# Patient Record
Sex: Male | Born: 1935 | Marital: Married | Smoking: Former smoker
Health system: Southern US, Community
[De-identification: ages and names within clinical notes are randomized; demographics above are authoritative.]

## PROBLEM LIST (undated history)

## (undated) DIAGNOSIS — R351 Nocturia: Secondary | ICD-10-CM

## (undated) DIAGNOSIS — I10 Essential (primary) hypertension: Secondary | ICD-10-CM

## (undated) DIAGNOSIS — R972 Elevated prostate specific antigen [PSA]: Secondary | ICD-10-CM

## (undated) DIAGNOSIS — K4090 Unilateral inguinal hernia, without obstruction or gangrene, not specified as recurrent: Secondary | ICD-10-CM

## (undated) DIAGNOSIS — N400 Enlarged prostate without lower urinary tract symptoms: Secondary | ICD-10-CM

## (undated) DIAGNOSIS — IMO0001 Reserved for inherently not codable concepts without codable children: Secondary | ICD-10-CM

## (undated) DIAGNOSIS — R3915 Urgency of urination: Secondary | ICD-10-CM

## (undated) DIAGNOSIS — K219 Gastro-esophageal reflux disease without esophagitis: Secondary | ICD-10-CM

## (undated) DIAGNOSIS — R1310 Dysphagia, unspecified: Secondary | ICD-10-CM

## (undated) DIAGNOSIS — M1990 Unspecified osteoarthritis, unspecified site: Secondary | ICD-10-CM

## (undated) DIAGNOSIS — N410 Acute prostatitis: Secondary | ICD-10-CM

## (undated) HISTORY — DX: Elevated prostate specific antigen (PSA): R97.20

## (undated) HISTORY — DX: Gastro-esophageal reflux disease without esophagitis: K21.9

## (undated) HISTORY — DX: Reserved for inherently not codable concepts without codable children: IMO0001

## (undated) HISTORY — DX: Dysphagia, unspecified: R13.10

## (undated) HISTORY — PX: HERNIA REPAIR: SHX51

## (undated) HISTORY — PX: LEG SURGERY: SHX1003

## (undated) HISTORY — PX: CATARACT EXTRACTION: SUR2

## (undated) HISTORY — DX: Urgency of urination: R39.15

## (undated) HISTORY — DX: Unilateral inguinal hernia, without obstruction or gangrene, not specified as recurrent: K40.90

## (undated) HISTORY — DX: Benign prostatic hyperplasia without lower urinary tract symptoms: N40.0

## (undated) HISTORY — DX: Unspecified osteoarthritis, unspecified site: M19.90

## (undated) HISTORY — DX: Essential (primary) hypertension: I10

## (undated) HISTORY — DX: Nocturia: R35.1

## (undated) HISTORY — DX: Acute prostatitis: N41.0

---

## 2008-07-31 ENCOUNTER — Ambulatory Visit: Payer: Self-pay | Admitting: Unknown Physician Specialty

## 2008-08-31 ENCOUNTER — Ambulatory Visit: Payer: Self-pay | Admitting: Unknown Physician Specialty

## 2010-07-09 ENCOUNTER — Ambulatory Visit: Payer: Self-pay | Admitting: Unknown Physician Specialty

## 2010-07-30 ENCOUNTER — Ambulatory Visit: Payer: Self-pay | Admitting: Surgery

## 2010-07-30 DIAGNOSIS — I10 Essential (primary) hypertension: Secondary | ICD-10-CM

## 2010-08-08 ENCOUNTER — Ambulatory Visit: Payer: Self-pay | Admitting: Surgery

## 2010-08-09 LAB — PATHOLOGY REPORT

## 2011-10-21 ENCOUNTER — Ambulatory Visit: Payer: Self-pay | Admitting: Unknown Physician Specialty

## 2011-10-22 LAB — PATHOLOGY REPORT

## 2014-12-04 ENCOUNTER — Other Ambulatory Visit: Payer: Self-pay | Admitting: Family Medicine

## 2014-12-04 DIAGNOSIS — M79604 Pain in right leg: Secondary | ICD-10-CM

## 2014-12-05 ENCOUNTER — Ambulatory Visit
Admission: RE | Admit: 2014-12-05 | Discharge: 2014-12-05 | Disposition: A | Discharge status: Home / Self Care | Payer: Commercial Managed Care - HMO | Source: Ambulatory Visit | Attending: Family Medicine | Admitting: Family Medicine

## 2014-12-05 DIAGNOSIS — M79604 Pain in right leg: Secondary | ICD-10-CM | POA: Diagnosis not present

## 2015-02-27 ENCOUNTER — Encounter: Payer: Self-pay | Admitting: *Deleted

## 2015-03-02 ENCOUNTER — Ambulatory Visit (INDEPENDENT_AMBULATORY_CARE_PROVIDER_SITE_OTHER): Payer: Commercial Managed Care - HMO | Admitting: Urology

## 2015-03-02 ENCOUNTER — Encounter: Payer: Self-pay | Admitting: Urology

## 2015-03-02 VITALS — BP 160/79 | HR 97 | Ht 67.0 in | Wt 186.9 lb

## 2015-03-02 DIAGNOSIS — N401 Enlarged prostate with lower urinary tract symptoms: Secondary | ICD-10-CM

## 2015-03-02 DIAGNOSIS — N138 Other obstructive and reflux uropathy: Secondary | ICD-10-CM

## 2015-03-02 NOTE — Progress Notes (Signed)
03/02/2015 8:00 PM   Adam [name] 08/06/1929 [id number]  Referring provider: Marina Goodellale E Feldpausch, MD [address] [facility] [place], [place] [address]  Chief Complaint  Patient presents with  . Benign Prostatic Hypertrophy    1 year recheck  . Elevated PSA    HPI: Patient is an 81 year old white male with a history of BPH with LUTS who presents today for his yearly follow-up.  BPH WITH LUTS His IPSS score today is 12, which is moderate lower urinary tract symptomatology. He is pleased with his quality life due to his urinary symptoms.  His major complaint today post void dribbling and nocturia x 1.  He has had these symptoms for several years.  He denies any dysuria, hematuria or suprapubic pain.  He also denies any recent fevers, chills, nausea or vomiting.  He has a family history of PCa, with his brother having prostate cancer.         IPSS      03/02/15 0900       International Prostate Symptom Score   How often have you had the sensation of not emptying your bladder? Less than 1 in 5     How often have you had to urinate less than every two hours? Less than half the time     How often have you found you stopped and started again several times when you urinated? More than half the time     How often have you found it difficult to postpone urination? Less than 1 in 5 times     How often have you had a weak urinary stream? About half the time     How often have you had to strain to start urination? Not at All     How many times did you typically get up at night to urinate? 1 Time     Total IPSS Score 12     Quality of Life due to urinary symptoms   If you were to spend the rest of your life with your urinary condition just the way it is now how would you feel about that? Pleased        Score:  1-7 Mild 8-19 Moderate 20-35 Severe     PMH: Past Medical History  Diagnosis Date  . HTN (hypertension)   . Osteoarthritis   .  Unilateral inguinal hernia   . Esophageal reflux   . Dysphagia   . Elevated PSA   . Frequency   . Urgency of micturation   . Nocturia   . BPH (benign prostatic hypertrophy)   . Acute prostatitis     Surgical History: Past Surgical History  Procedure Laterality Date  . Hernia repair      bilateral inguinal  . Leg surgery      leg trauma as a child  . Cataract extraction Bilateral     Home Medications:    Medication List       This list is accurate as of: 03/02/15 11:59 PM.  Always use your most recent med list.               aspirin EC 81 MG tablet  Take by mouth.     cetirizine 10 MG tablet  Commonly known as:  ZYRTEC  Take by mouth.     enalapril 20 MG tablet  Commonly known as:  VASOTEC  Take by mouth.     Fish Oil 1000 MG Caps  Take by  mouth.     hydrochlorothiazide 25 MG tablet  Commonly known as:  HYDRODIURIL  Take by mouth.     MULTI-VITAMINS Tabs  Take by mouth.     omeprazole 20 MG capsule  Commonly known as:  PRILOSEC  Take by mouth.     traMADol 50 MG tablet  Commonly known as:  ULTRAM  Take by mouth.     triamcinolone lotion 0.1 %  Commonly known as:  KENALOG  Apply topically.     vitamin C 500 MG tablet  Commonly known as:  ASCORBIC ACID  Take by mouth.        Allergies:  Allergies  Allergen Reactions  . Penicillin V Potassium Rash    Family History: Family History  Problem Relation Age of Onset  . Colon cancer    . Diabetes    . Heart disease    . Skin cancer    . Kidney disease Neg Hx   . Prostate cancer Brother     Social History:  reports that he has quit smoking. He does not have any smokeless tobacco history on file. He reports that he does not drink alcohol or use illicit drugs.  ROS: UROLOGY Frequent Urination?: No Hard to postpone urination?: No Burning/pain with urination?: No Get up at night to urinate?: Yes Leakage of urine?: Yes Urine stream starts and stops?: No Trouble starting stream?:  No Do you have to strain to urinate?: No Blood in urine?: No Urinary tract infection?: No Sexually transmitted disease?: No Injury to kidneys or bladder?: No Painful intercourse?: No Weak stream?: No Erection problems?: No Penile pain?: No  Gastrointestinal Nausea?: No Vomiting?: No Indigestion/heartburn?: No Diarrhea?: No Constipation?: No  Constitutional Fever: No Night sweats?: No Weight loss?: No Fatigue?: No  Skin Skin rash/lesions?: No Itching?: No  Eyes Blurred vision?: No Double vision?: No  Ears/Nose/Throat Sore throat?: No Sinus problems?: No  Hematologic/Lymphatic Swollen glands?: No Easy bruising?: No  Cardiovascular Leg swelling?: No Chest pain?: No  Respiratory Cough?: No Shortness of breath?: No  Endocrine Excessive thirst?: No  Musculoskeletal Back pain?: No Joint pain?: No  Neurological Headaches?: No Dizziness?: No  Psychologic Depression?: No Anxiety?: No  Physical Exam: BP 160/79 mmHg  Pulse 97  Ht  (1.702 m)  Wt 186 lb 14.4 oz (84.777 kg)  BMI 29.27 kg/m2  GU: No CVA tenderness.  No bladder fullness or masses.  Patient with circumcised phallus.  Urethral meatus is patent.  No penile discharge. No penile lesions or rashes. Scrotum without lesions, cysts, rashes and/or edema.  Testicles are located scrotally bilaterally. No masses are appreciated in the testicles. Left and right epididymis are normal. Rectal: Patient with  normal sphincter tone. Anus and perineum without scarring or rashes. No rectal masses are appreciated. Prostate is approximately 70 (could not palpated entire gland due to buttocks tissue) grams.      Laboratory Data: PSA history  3.4 ng/mL on 08/08/2012  4.3 ng/mL on 03/01/2013  5.0 ng/mL on 04/01/2013  2.9 ng/mL on 08/29/2013  3.0 ng/mL on 03/01/2014     Assessment & Plan:    1. BPH with LUTS:   Patient's I PSS score is 12/1.  He does not find his postvoid dribbling or nocturia especially  bothersome to him at this time. We will continue to monitor. He will return in 1 year for IPSS score and exam.  - PSA   Return in about 1 year (around 03/01/2016) for IPSS and exam.  Danaysia Rader,  PA-C  [facility] [address] [place], [place] [address] [phone]

## 2015-03-03 LAB — PSA: PROSTATE SPECIFIC AG, SERUM: 3.7 ng/mL (ref 0.0–4.0)

## 2015-03-04 DIAGNOSIS — N401 Enlarged prostate with lower urinary tract symptoms: Principal | ICD-10-CM

## 2015-03-04 DIAGNOSIS — N138 Other obstructive and reflux uropathy: Secondary | ICD-10-CM | POA: Insufficient documentation

## 2015-03-07 ENCOUNTER — Telehealth: Payer: Self-pay

## 2015-03-07 NOTE — Telephone Encounter (Signed)
Spoke with pt in reference to PSA. Pt voiced understanding.  

## 2015-03-07 NOTE — Telephone Encounter (Signed)
-----   Message from Harle BattiestShannon A McGowan, PA-C sent at 03/04/2015  8:01 PM EST ----- PSA is stable.  We will see him next year.

## 2015-07-10 DIAGNOSIS — M1712 Unilateral primary osteoarthritis, left knee: Secondary | ICD-10-CM | POA: Insufficient documentation

## 2015-07-10 DIAGNOSIS — M1711 Unilateral primary osteoarthritis, right knee: Secondary | ICD-10-CM | POA: Insufficient documentation

## 2016-01-04 DIAGNOSIS — E7800 Pure hypercholesterolemia, unspecified: Secondary | ICD-10-CM | POA: Insufficient documentation

## 2016-01-04 DIAGNOSIS — I10 Essential (primary) hypertension: Secondary | ICD-10-CM | POA: Insufficient documentation

## 2016-01-04 DIAGNOSIS — E1122 Type 2 diabetes mellitus with diabetic chronic kidney disease: Secondary | ICD-10-CM | POA: Insufficient documentation

## 2016-01-04 DIAGNOSIS — N183 Chronic kidney disease, stage 3 (moderate): Secondary | ICD-10-CM

## 2016-01-04 DIAGNOSIS — K219 Gastro-esophageal reflux disease without esophagitis: Secondary | ICD-10-CM | POA: Insufficient documentation

## 2016-03-03 ENCOUNTER — Encounter: Payer: Self-pay | Admitting: Urology

## 2016-03-03 ENCOUNTER — Ambulatory Visit (INDEPENDENT_AMBULATORY_CARE_PROVIDER_SITE_OTHER): Payer: Commercial Managed Care - HMO | Admitting: Urology

## 2016-03-03 VITALS — BP 148/70 | HR 96 | Ht 67.0 in | Wt 176.1 lb

## 2016-03-03 DIAGNOSIS — N401 Enlarged prostate with lower urinary tract symptoms: Secondary | ICD-10-CM

## 2016-03-03 DIAGNOSIS — N138 Other obstructive and reflux uropathy: Secondary | ICD-10-CM | POA: Diagnosis not present

## 2016-03-03 NOTE — Progress Notes (Signed)
03/03/2016 2:38 PM   Adam Russellobert E Propst 01/10/1930 [id number]  Referring provider: Marina Goodellale E Feldpausch, MD [address] [facility] [place] [place], [place] [id number]  Chief Complaint  Patient presents with  . Benign Prostatic Hypertrophy    HPI: Patient is an 82 year old Caucasian male with a history of BPH with LUTS who presents today for his yearly follow-up.  BPH WITH LUTS His IPSS score today is 12, which is moderate lower urinary tract symptomatology. He is pleased with his quality life due to his urinary symptoms.  His previous IPSS score was 12/1.  His major complaint today post void dribbling and nocturia x 1.  He has had these symptoms for several years.  He denies any dysuria, hematuria or suprapubic pain.  He also denies any recent fevers, chills, nausea or vomiting.  He has a family history of PCa, with his brother having prostate cancer.         IPSS    Row Name 03/03/16 1400         International Prostate Symptom Score   How often have you had the sensation of not emptying your bladder? Less than 1 in 5     How often have you had to urinate less than every two hours? Less than half the time     How often have you found you stopped and started again several times when you urinated? Not at All     How often have you found it difficult to postpone urination? Not at All     How often have you had a weak urinary stream? About half the time     How often have you had to strain to start urination? About half the time     How many times did you typically get up at night to urinate? 3 Times     Total IPSS Score 12       Quality of Life due to urinary symptoms   If you were to spend the rest of your life with your urinary condition just the way it is now how would you feel about that? Pleased        Score:  1-7 Mild 8-19 Moderate 20-35 Severe   PMH: Past Medical History:  Diagnosis Date  . Acute prostatitis   . BPH (benign prostatic  hypertrophy)   . Dysphagia   . Elevated PSA   . Esophageal reflux   . Frequency   . HTN (hypertension)   . Nocturia   . Osteoarthritis   . Unilateral inguinal hernia   . Urgency of micturation     Surgical History: Past Surgical History:  Procedure Laterality Date  . CATARACT EXTRACTION Bilateral   . HERNIA REPAIR     bilateral inguinal  . LEG SURGERY     leg trauma as a child    Home Medications:    Medication List       Accurate as of 03/03/16  2:38 PM. Always use your most recent med list.          aspirin EC 81 MG tablet Take by mouth.   cetirizine 10 MG tablet Commonly known as:  ZYRTEC Take by mouth.   enalapril 20 MG tablet Commonly known as:  VASOTEC Take by mouth.   Fish Oil 1000 MG Caps Take by mouth.   hydrochlorothiazide 25 MG tablet Commonly known as:  HYDRODIURIL Take by mouth.   MULTI-VITAMINS Tabs Take by mouth.   omeprazole 20 MG  capsule Commonly known as:  PRILOSEC Take by mouth.   traMADol 50 MG tablet Commonly known as:  ULTRAM Take by mouth.   triamcinolone lotion 0.1 % Commonly known as:  KENALOG Apply topically.   vitamin C 500 MG tablet Commonly known as:  ASCORBIC ACID Take by mouth.       Allergies:  Allergies  Allergen Reactions  . Penicillin V Potassium Rash    Family History: Family History  Problem Relation Age of Onset  . Colon cancer    . Diabetes    . Heart disease    . Skin cancer    . Kidney disease Neg Hx   . Prostate cancer Brother     Social History:  reports that he has quit smoking. He has never used smokeless tobacco. He reports that he does not drink alcohol or use drugs.  ROS: UROLOGY Frequent Urination?: No Hard to postpone urination?: No Burning/pain with urination?: No Get up at night to urinate?: No Leakage of urine?: No Urine stream starts and stops?: No Trouble starting stream?: No Do you have to strain to urinate?: No Blood in urine?: No Urinary tract infection?:  No Sexually transmitted disease?: No Injury to kidneys or bladder?: No Painful intercourse?: No Weak stream?: Yes Erection problems?: No Penile pain?: No  Gastrointestinal Nausea?: No Vomiting?: No Indigestion/heartburn?: No Diarrhea?: Yes Constipation?: No  Constitutional Fever: No Night sweats?: No Weight loss?: No Fatigue?: No  Skin Skin rash/lesions?: No Itching?: No  Eyes Blurred vision?: No Double vision?: No  Ears/Nose/Throat Sore throat?: No Sinus problems?: No  Hematologic/Lymphatic Swollen glands?: No Easy bruising?: No  Cardiovascular Leg swelling?: No Chest pain?: No  Respiratory Cough?: No Shortness of breath?: No  Endocrine Excessive thirst?: No  Musculoskeletal Back pain?: No Joint pain?: No  Neurological Headaches?: No Dizziness?: No  Psychologic Depression?: No Anxiety?: No  Physical Exam: BP (!) 148/70 (BP Location: Left Arm, Patient Position: Sitting, Cuff Size: Normal)   Pulse 96   Ht 5\' 7"  (1.702 m)   Wt 176 lb 1.6 oz (79.9 kg)   BMI 27.58 kg/m   GU: No CVA tenderness.  No bladder fullness or masses.  Patient with circumcised phallus.  Urethral meatus is patent.  No penile discharge. No penile lesions or rashes. Scrotum without lesions, cysts, rashes and/or edema.  Testicles are located scrotally bilaterally. No masses are appreciated in the testicles. Left and right epididymis are normal. Rectal: Patient with  normal sphincter tone. Anus and perineum without scarring or rashes. No rectal masses are appreciated. Prostate is approximately 70 (could not palpated entire gland due to buttocks tissue) grams.      Laboratory Data: PSA history  3.4 ng/mL on 08/08/2012  4.3 ng/mL on 03/01/2013  5.0 ng/mL on 04/01/2013  2.9 ng/mL on 08/29/2013  3.0 ng/mL on 03/01/2014  3.7 ng/mL on 03/02/2015  Assessment & Plan:    1. BPH with LUTS:   Patient's I PSS score is 12/1.  He does not find his postvoid dribbling or nocturia  especially bothersome to him at this time. We will continue to monitor. He will return in 1 year for IPSS score and exam.    Return in about 1 year (around 03/03/2017) for IPSS  and exam.  Michiel CowboySHANNON Marice Guidone, [facility]-C  [facility] [address], [address] [place], [place] [address] [phone]

## 2016-03-12 ENCOUNTER — Encounter: Payer: Self-pay | Admitting: Urology

## 2017-03-02 NOTE — Progress Notes (Signed)
03/03/2017 10:33 AM   [name] 1929-09-20 [id number]  Referring provider: Marina GoodellFeldpausch, Dale E, MD [address] [place], [place] [address]  Chief Complaint  Patient presents with  . Benign Prostatic Hypertrophy    HPI: Patient is an 83 year old Caucasian male with a history of BPH with LUTS who presents today for his yearly follow-up.  BPH WITH LUTS His IPSS score today is 20, which is severe lower urinary tract symptomatology. He is mixed with his quality life due to his urinary symptoms.  His previous IPSS score was 12/1.  He states he is going great today.  He denies any dysuria, hematuria or suprapubic pain.  He also denies any recent fevers, chills, nausea or vomiting.  He has a family history of PCa, with his brother having prostate cancer.     IPSS    Row Name 03/03/17 1000         International Prostate Symptom Score   How often have you had the sensation of not emptying your bladder?  Almost always     How often have you had to urinate less than every two hours?  Almost always     How often have you found you stopped and started again several times when you urinated?  Not at All     How often have you found it difficult to postpone urination?  Not at All     How often have you had a weak urinary stream?  Almost always     How often have you had to strain to start urination?  Not at All     How many times did you typically get up at night to urinate?  5 Times     Total IPSS Score  20       Quality of Life due to urinary symptoms   If you were to spend the rest of your life with your urinary condition just the way it is now how would you feel about that?  Mixed        Score:  1-7 Mild 8-19 Moderate 20-35 Severe   PMH: Past Medical History:  Diagnosis Date  . Acute prostatitis   . BPH (benign prostatic hypertrophy)   . Dysphagia   . Elevated PSA   . Esophageal reflux   . Frequency   . HTN (hypertension)   . Nocturia   . Osteoarthritis   .  Unilateral inguinal hernia   . Urgency of micturation     Surgical History: Past Surgical History:  Procedure Laterality Date  . CATARACT EXTRACTION Bilateral   . HERNIA REPAIR     bilateral inguinal  . LEG SURGERY     leg trauma as a child    Home Medications:  Allergies as of 03/03/2017      Reactions   Penicillin V Potassium Rash      Medication List        Accurate as of 03/03/17 10:33 AM. Always use your most recent med list.          aspirin EC 81 MG tablet Take 81 mg daily by mouth.   cetirizine 10 MG tablet Commonly known as:  ZYRTEC Take 10 mg as needed by mouth.   enalapril 20 MG tablet Commonly known as:  VASOTEC Take 20 mg daily by mouth.   Fish Oil 1000 MG Caps Take 1 capsule daily by mouth.   hydrochlorothiazide 25 MG tablet Commonly known as:  HYDRODIURIL Take 25 mg daily by mouth.  MULTI-VITAMINS Tabs Take 1 capsule daily by mouth.   omeprazole 20 MG capsule Commonly known as:  PRILOSEC Take 20 mg daily by mouth.   traMADol 50 MG tablet Commonly known as:  ULTRAM Take 50 mg daily as needed by mouth.   triamcinolone lotion 0.1 % Commonly known as:  KENALOG Apply 1 application 2 (two) times daily as needed topically.   vitamin C 500 MG tablet Commonly known as:  ASCORBIC ACID Take by mouth.       Allergies:  Allergies  Allergen Reactions  . Penicillin V Potassium Rash    Family History: Family History  Problem Relation Age of Onset  . Colon cancer Unknown   . Diabetes Unknown   . Heart disease Unknown   . Skin cancer Unknown   . Prostate cancer Brother   . Kidney disease Neg Hx     Social History:  reports that he quit smoking about 23 years ago. he has never used smokeless tobacco. He reports that he does not drink alcohol or use drugs.  ROS: UROLOGY Frequent Urination?: No Hard to postpone urination?: No Burning/pain with urination?: No Get up at night to urinate?: No Leakage of urine?: No Urine stream  starts and stops?: No Trouble starting stream?: No Do you have to strain to urinate?: No Blood in urine?: No Urinary tract infection?: No Sexually transmitted disease?: No Injury to kidneys or bladder?: No Painful intercourse?: No Weak stream?: No Erection problems?: No Penile pain?: No  Gastrointestinal Nausea?: No Vomiting?: No Indigestion/heartburn?: No Diarrhea?: No Constipation?: No  Constitutional Fever: No Night sweats?: No Weight loss?: Yes Fatigue?: No  Skin Skin rash/lesions?: Yes Itching?: No  Eyes Blurred vision?: No Double vision?: No  Ears/Nose/Throat Sore throat?: No Sinus problems?: No  Hematologic/Lymphatic Swollen glands?: No Easy bruising?: No  Cardiovascular Leg swelling?: No Chest pain?: No  Respiratory Cough?: No Shortness of breath?: No  Endocrine Excessive thirst?: No  Musculoskeletal Back pain?: No Joint pain?: No  Neurological Headaches?: No Dizziness?: No  Psychologic Depression?: No Anxiety?: No  Physical Exam: BP (!) 166/95   Pulse 98   Ht 5\' 7"  (1.702 m)   Wt 171 lb 1.6 oz (77.6 kg)   BMI 26.80 kg/m   GU: No CVA tenderness.  No bladder fullness or masses.  Patient with circumcised phallus.  Urethral meatus is patent.  No penile discharge. No penile lesions or rashes. Scrotum without lesions, cysts, rashes and/or edema.  Testicles are located scrotally bilaterally. No masses are appreciated in the testicles. Left and right epididymis are normal. Rectal: Patient with  normal sphincter tone. Anus and perineum without scarring or rashes. No rectal masses are appreciated. Prostate is approximately 70 (could not palpated entire gland due to buttocks tissue) grams.      Laboratory Data: PSA history  3.4 ng/mL on 08/08/2012  4.3 ng/mL on 03/01/2013  5.0 ng/mL on 04/01/2013  2.9 ng/mL on 08/29/2013  3.0 ng/mL on 03/01/2014  3.7 ng/mL on 03/02/2015  Assessment & Plan:    1. BPH with LUTS:   Patient's I PSS score  is 20/3, it is worsening.  He does not find his postvoid dribbling or nocturia especially bothersome to him at this time. We will continue to monitor. He will return in 1 year for IPSS score and exam.  Return in about 1 year (around 03/03/2018) for I PSS and exam.  Adam [name], [facility] HospitalA-C  [facility] [address], [address] [place], [place] [address] [phone]

## 2017-03-03 ENCOUNTER — Encounter: Payer: Self-pay | Admitting: Urology

## 2017-03-03 ENCOUNTER — Ambulatory Visit: Payer: Medicare HMO | Admitting: Urology

## 2017-03-03 VITALS — BP 166/95 | HR 98 | Ht 67.0 in | Wt 171.1 lb

## 2017-03-03 DIAGNOSIS — N401 Enlarged prostate with lower urinary tract symptoms: Secondary | ICD-10-CM

## 2017-03-03 DIAGNOSIS — N138 Other obstructive and reflux uropathy: Secondary | ICD-10-CM

## 2017-10-16 ENCOUNTER — Encounter: Payer: Self-pay | Admitting: Radiology

## 2017-10-16 ENCOUNTER — Emergency Department: Payer: Medicare HMO

## 2017-10-16 ENCOUNTER — Emergency Department
Admission: EM | Admit: 2017-10-16 | Discharge: 2017-10-16 | Disposition: A | Discharge status: Home / Self Care | Payer: Medicare HMO | Attending: Student in an Organized Health Care Education/Training Program | Admitting: Student in an Organized Health Care Education/Training Program

## 2017-10-16 DIAGNOSIS — R000 Tachycardia, unspecified: Secondary | ICD-10-CM | POA: Insufficient documentation

## 2017-10-16 DIAGNOSIS — S51012A Laceration without foreign body of left elbow, initial encounter: Secondary | ICD-10-CM | POA: Diagnosis not present

## 2017-10-16 DIAGNOSIS — M79602 Pain in left arm: Secondary | ICD-10-CM

## 2017-10-16 DIAGNOSIS — Y9389 Activity, other specified: Secondary | ICD-10-CM | POA: Insufficient documentation

## 2017-10-16 DIAGNOSIS — Z7982 Long term (current) use of aspirin: Secondary | ICD-10-CM | POA: Insufficient documentation

## 2017-10-16 DIAGNOSIS — Y999 Unspecified external cause status: Secondary | ICD-10-CM | POA: Insufficient documentation

## 2017-10-16 DIAGNOSIS — S161XXA Strain of muscle, fascia and tendon at neck level, initial encounter: Secondary | ICD-10-CM | POA: Insufficient documentation

## 2017-10-16 DIAGNOSIS — S199XXA Unspecified injury of neck, initial encounter: Secondary | ICD-10-CM | POA: Diagnosis present

## 2017-10-16 DIAGNOSIS — Z87891 Personal history of nicotine dependence: Secondary | ICD-10-CM | POA: Insufficient documentation

## 2017-10-16 DIAGNOSIS — I129 Hypertensive chronic kidney disease with stage 1 through stage 4 chronic kidney disease, or unspecified chronic kidney disease: Secondary | ICD-10-CM | POA: Insufficient documentation

## 2017-10-16 DIAGNOSIS — N183 Chronic kidney disease, stage 3 (moderate): Secondary | ICD-10-CM | POA: Diagnosis not present

## 2017-10-16 DIAGNOSIS — E1122 Type 2 diabetes mellitus with diabetic chronic kidney disease: Secondary | ICD-10-CM | POA: Insufficient documentation

## 2017-10-16 DIAGNOSIS — Z79899 Other long term (current) drug therapy: Secondary | ICD-10-CM | POA: Insufficient documentation

## 2017-10-16 DIAGNOSIS — Y92410 Unspecified street and highway as the place of occurrence of the external cause: Secondary | ICD-10-CM | POA: Diagnosis not present

## 2017-10-16 LAB — CBC WITH DIFFERENTIAL/PLATELET
BASOS ABS: 0 10*3/uL (ref 0–0.1)
Basophils Relative: 1 %
EOS ABS: 0.1 10*3/uL (ref 0–0.7)
Eosinophils Relative: 2 %
HCT: 44.7 % (ref 40.0–52.0)
HEMOGLOBIN: 15.3 g/dL (ref 13.0–18.0)
LYMPHS ABS: 1.5 10*3/uL (ref 1.0–3.6)
LYMPHS PCT: 21 %
MCH: 32.2 pg (ref 26.0–34.0)
MCHC: 34.2 g/dL (ref 32.0–36.0)
MCV: 94 fL (ref 80.0–100.0)
Monocytes Absolute: 0.8 10*3/uL (ref 0.2–1.0)
Monocytes Relative: 11 %
NEUTROS PCT: 65 %
Neutro Abs: 4.6 10*3/uL (ref 1.4–6.5)
Platelets: 152 10*3/uL (ref 150–440)
RBC: 4.75 MIL/uL (ref 4.40–5.90)
RDW: 12.7 % (ref 11.5–14.5)
WBC: 7 10*3/uL (ref 3.8–10.6)

## 2017-10-16 LAB — COMPREHENSIVE METABOLIC PANEL
ALBUMIN: 3.9 g/dL (ref 3.5–5.0)
ALK PHOS: 76 U/L (ref 38–126)
ALT: 20 U/L (ref 0–44)
AST: 36 U/L (ref 15–41)
Anion gap: 9 (ref 5–15)
BUN: 27 mg/dL — AB (ref 8–23)
CALCIUM: 8.8 mg/dL — AB (ref 8.9–10.3)
CO2: 22 mmol/L (ref 22–32)
CREATININE: 1.41 mg/dL — AB (ref 0.61–1.24)
Chloride: 103 mmol/L (ref 98–111)
GFR calc non Af Amer: 44 mL/min — ABNORMAL LOW (ref >60)
GFR, EST AFRICAN AMERICAN: 50 mL/min — AB (ref >60)
GLUCOSE: 196 mg/dL — AB (ref 70–99)
Potassium: 3.7 mmol/L (ref 3.5–5.1)
SODIUM: 134 mmol/L — AB (ref 135–145)
Total Bilirubin: 0.5 mg/dL (ref 0.3–1.2)
Total Protein: 6.5 g/dL (ref 6.5–8.1)

## 2017-10-16 LAB — LIPASE, BLOOD: Lipase: 28 U/L (ref 11–51)

## 2017-10-16 LAB — TROPONIN I: Troponin I: <0.03 ng/mL (ref <0.03)

## 2017-10-16 MED ORDER — IOPAMIDOL (ISOVUE-370) INJECTION 76%
75.0000 mL | Freq: Once | INTRAVENOUS | Status: AC | PRN
  Administered 2017-10-16: 75 mL via INTRAVENOUS

## 2017-10-16 MED ORDER — LIDOCAINE 5 % EX PTCH: 1.0000 | MEDICATED_PATCH | Freq: Two times a day (BID) | CUTANEOUS | 0 refills | Status: DC

## 2017-10-16 MED ORDER — SODIUM CHLORIDE 0.9 % IV BOLUS
250.0000 mL | Freq: Once | INTRAVENOUS | Status: AC
  Administered 2017-10-16: 250 mL via INTRAVENOUS

## 2017-10-16 MED ORDER — ACETAMINOPHEN 500 MG PO TABS: 1000.0000 mg | ORAL_TABLET | Freq: Once | ORAL | Status: DC
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
## 2017-10-16 NOTE — Discharge Instructions (Addendum)
°  IMPRESSION:  1. No CT evidence for acute traumatic injury within the chest,  abdomen, and pelvis.  2. Evidence for asbestos exposure with scattered pleural thickening  with calcified pleural plaques involving the lungs bilaterally, left  greater than right, with 3.8 x 6.4 cm area of masslike enhancement  along the left hemidiaphragm. Further workup with PET-CT suggested  for further evaluation. Alternatively, a short interval follow-up CT  in 3 months to evaluate stability may also be helpful for further  evaluation.  3. Moderate hiatal hernia.  4. Colonic diverticulosis without evidence for acute diverticulitis.  5. Enlarged prostate.
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
## 2018-03-02 ENCOUNTER — Ambulatory Visit: Payer: Medicare HMO | Admitting: Urology

## 2018-03-05 ENCOUNTER — Ambulatory Visit: Payer: Medicare HMO | Admitting: Urology

## 2018-03-08 ENCOUNTER — Ambulatory Visit (INDEPENDENT_AMBULATORY_CARE_PROVIDER_SITE_OTHER): Payer: Medicare HMO | Admitting: Urology

## 2018-03-08 ENCOUNTER — Encounter: Payer: Self-pay | Admitting: Urology

## 2018-03-08 VITALS — BP 156/79 | HR 121 | Ht 65.0 in | Wt 160.0 lb

## 2018-03-08 DIAGNOSIS — R351 Nocturia: Secondary | ICD-10-CM

## 2018-03-08 DIAGNOSIS — N401 Enlarged prostate with lower urinary tract symptoms: Secondary | ICD-10-CM

## 2018-03-08 DIAGNOSIS — N138 Other obstructive and reflux uropathy: Secondary | ICD-10-CM | POA: Diagnosis not present

## 2018-03-08 LAB — BLADDER SCAN AMB NON-IMAGING

## 2018-03-08 MED ORDER — TAMSULOSIN HCL 0.4 MG PO CAPS: 0.4000 mg | ORAL_CAPSULE | Freq: Every day | ORAL | 1 refills | Status: DC

## 2018-03-08 NOTE — Progress Notes (Signed)
03/08/2018 10:13 AM   Adam [name] [birth year] [id number]  Referring provider: Marina Goodell, MD [address] [place], [place] [address]  Chief Complaint  Patient presents with  . Benign Prostatic Hypertrophy    1year    HPI: Patient is an 84 year old Caucasian male with a history of BPH with LUTS who presents today for his yearly follow-up.  BPH WITH LUTS His IPSS score today is 19, which is moderate lower urinary tract symptomatology.  His PVR is 76 mL.  He is mostly satisfied with his quality life due to his urinary symptoms.  His previous IPSS score was 20/3.  His main complaints today are nocturia x 1-4 and a weak stream.  He denies any dysuria, hematuria or suprapubic pain.  He also denies any recent fevers, chills, nausea or vomiting.  He has a family history of PCa, with his brother having prostate cancer.    IPSS    Row Name 03/08/18 0900         International Prostate Symptom Score   How often have you had the sensation of not emptying your bladder?  About half the time     How often have you had to urinate less than every two hours?  Almost always     How often have you found you stopped and started again several times when you urinated?  Less than half the time     How often have you found it difficult to postpone urination?  Less than half the time     How often have you had a weak urinary stream?  About half the time     How often have you had to strain to start urination?  Not at All     How many times did you typically get up at night to urinate?  4 Times     Total IPSS Score  19       Quality of Life due to urinary symptoms   If you were to spend the rest of your life with your urinary condition just the way it is now how would you feel about that?  Mostly Satisfied        Score:  1-7 Mild 8-19 Moderate 20-35 Severe   PMH: Past Medical History:  Diagnosis Date  . Acute prostatitis   . BPH (benign prostatic hypertrophy)   . Dysphagia   .  Elevated PSA   . Esophageal reflux   . Frequency   . HTN (hypertension)   . Nocturia   . Osteoarthritis   . Unilateral inguinal hernia   . Urgency of micturation     Surgical History: Past Surgical History:  Procedure Laterality Date  . CATARACT EXTRACTION Bilateral   . HERNIA REPAIR     bilateral inguinal  . LEG SURGERY     leg trauma as a child    Home Medications:  Allergies as of 03/08/2018      Reactions   Penicillin V Potassium Rash      Medication List        Accurate as of 03/08/18 10:13 AM. Always use your most recent med list.          aspirin EC 81 MG tablet Take 81 mg daily by mouth.   cetirizine 10 MG tablet Commonly known as:  ZYRTEC Take 10 mg as needed by mouth.   enalapril 20 MG tablet Commonly known as:  VASOTEC Take 20 mg daily by mouth.   Fish Oil 1000  MG Caps Take 1 capsule daily by mouth.   hydrochlorothiazide 25 MG tablet Commonly known as:  HYDRODIURIL Take 25 mg daily by mouth.   MULTI-VITAMINS Tabs Take 1 capsule daily by mouth.   omeprazole 20 MG capsule Commonly known as:  PRILOSEC Take 20 mg daily by mouth.   tamsulosin 0.4 MG Caps capsule Commonly known as:  FLOMAX Take 1 capsule (0.4 mg total) by mouth daily.   traMADol 50 MG tablet Commonly known as:  ULTRAM Take 50 mg daily as needed by mouth.   triamcinolone lotion 0.1 % Commonly known as:  KENALOG Apply 1 application 2 (two) times daily as needed topically.   vitamin C 500 MG tablet Commonly known as:  ASCORBIC ACID Take by mouth.       Allergies:  Allergies  Allergen Reactions  . Penicillin V Potassium Rash    Family History: Family History  Problem Relation Age of Onset  . Colon cancer Unknown   . Diabetes Unknown   . Heart disease Unknown   . Skin cancer Unknown   . Prostate cancer Brother   . Kidney disease Neg Hx     Social History:  reports that he quit smoking about 24 years ago. He has never used smokeless tobacco. He reports that  he does not drink alcohol or use drugs.  ROS: UROLOGY Frequent Urination?: No Hard to postpone urination?: No Burning/pain with urination?: No Get up at night to urinate?: Yes Leakage of urine?: No Urine stream starts and stops?: No Trouble starting stream?: No Do you have to strain to urinate?: No Blood in urine?: No Urinary tract infection?: No Sexually transmitted disease?: No Injury to kidneys or bladder?: No Painful intercourse?: No Weak stream?: Yes Erection problems?: No Penile pain?: No  Gastrointestinal Nausea?: No Vomiting?: No Indigestion/heartburn?: No Diarrhea?: No Constipation?: No  Constitutional Fever: No Night sweats?: No Weight loss?: Yes Fatigue?: No  Skin Skin rash/lesions?: Yes Itching?: No  Eyes Blurred vision?: Yes Double vision?: No  Ears/Nose/Throat Sore throat?: No Sinus problems?: No  Hematologic/Lymphatic Swollen glands?: No Easy bruising?: No  Cardiovascular Leg swelling?: No Chest pain?: No  Respiratory Cough?: No Shortness of breath?: No  Endocrine Excessive thirst?: No  Musculoskeletal Back pain?: No Joint pain?: Yes  Neurological Headaches?: No Dizziness?: No  Psychologic Depression?: No Anxiety?: No  Physical Exam: BP (!) 156/79   Pulse (!) 121   Ht 5\' 5"  (1.651 m)   Wt 160 lb (72.6 kg)   BMI 26.63 kg/m   Constitutional: Well nourished. Alert and oriented, No acute distress. HEENT: [place] AT, moist mucus membranes. Trachea midline, no masses. Cardiovascular: No clubbing, cyanosis, or edema. Respiratory: Normal respiratory effort, no increased work of breathing. GI: Abdomen is soft, non tender, non distended, no abdominal masses. Liver and spleen not palpable.  No hernias appreciated.  Stool sample for occult testing is not indicated.   GU: No CVA tenderness.  No bladder fullness or masses.  Patient with uncircumcised phallus.  Foreskin easily retracted.  Adhesions of foreskin to the coronal ridge  dorsally.   Urethral meatus is patent.  No penile discharge. No penile lesions or rashes. Scrotum without lesions, cysts, rashes and/or edema.  Testicles are located scrotally bilaterally. No masses are appreciated in the testicles. Left and right epididymis are normal. Rectal: Patient with  normal sphincter tone. Anus and perineum without scarring or rashes. No rectal masses are appreciated. Prostate is approximately 60 + grams, could only palpate apex, no nodules are appreciated.  Skin: No rashes, bruises or suspicious lesions. Lymph: No cervical or inguinal adenopathy. Neurologic: Grossly intact, no focal deficits, moving all 4 extremities. Psychiatric: Normal mood and affect.   Laboratory Data: PSA history  3.4 ng/mL on 08/08/2012  4.3 ng/mL on 03/01/2013  5.0 ng/mL on 04/01/2013  2.9 ng/mL on 08/29/2013  3.0 ng/mL on 03/01/2014  3.7 ng/mL on 03/02/2015 I have reviewed the labs.  Assessment & Plan:    1. BPH with LUTS:   Patient's I PSS score is 19/2, it is improving.  We will continue to monitor. He will return in 1 year for IPSS score and exam. Most bothersome symptom is nocturia Start tamsulosin 0.4 mg RTC in one months for I PSS and PVR   2. Nocturia I explained to the patient that nocturia is often multi-factorial and difficult to treat.  Sleeping disorders, heart conditions, peripheral vascular disease, diabetes, an enlarged prostate for men, an urethral stricture causing bladder outlet obstruction and/or certain medications can contribute to nocturia. I have suggested that the patient avoid caffeine after noon and alcohol in the evening.  He or she may also benefit from fluid restrictions after 6:00 in the evening and voiding just prior to bedtime. I have explained that research studies have showed that over 84% of patients with sleep apnea reported frequent nighttime urination.   With sleep apnea, oxygen decreases, carbon dioxide increases, the blood become more acidic, the  heart rate drops and blood vessels in the lung constrict.  The body is then alerted that something is very wrong. The sleeper must wake enough to reopen the airway. By this time, the heart is racing and experiences a false signal of fluid overload. The heart excretes a hormone-like protein that tells the body to get rid of sodium and water, resulting in nocturia. I also informed the patient that a recent study noted that decreasing sodium intake to 2.3 grams daily, if they don't have issues with hyponatremia, can also reduce the number of nightly voids The patient may benefit from a discussion with his or her primary care physician to see if he or she has risk factors for sleep apnea or other sleep disturbances and obtaining a sleep study.   Return in about 1 month (around 04/07/2018) for IPSS and PVR.  Michiel CowboySHANNON Salik Grewell, PA-C  [facility] [address] [place], [place] [address] [phone]

## 2018-04-11 NOTE — Progress Notes (Signed)
04/12/2018 9:06 AM   Adam [name] 03-24-1930 [id number]  Referring provider: Marina Goodell, MD [address] [place], [place] [address]  Chief Complaint  Patient presents with  . Benign Prostatic Hypertrophy    1 month    HPI: Patient is an 84 year old Caucasian male with a history of BPH with LU TS with bothersome symptoms of weak urinary stream and nocturia who presents today for a one month follow up after a trial of tamsulosin.    BPH WITH LUTS His IPSS score today is 18, which is moderate lower urinary tract symptomatology.  His PVR is 63 mL.  He is mixed with his quality life due to his urinary symptoms.  His previous IPSS score was 19/2.  His previous PVR was 76 mL.  His main complaints today are urgency, nocturia x 1-4 and intermittency.  He feels that the tamsulosin has been helpful as he has seen improvement in his nocturia.  He does note that he has had some spraying of the urine stream.  He denies any dysuria, hematuria or suprapubic pain.  He also denies any recent fevers, chills, nausea or vomiting.  He has a family history of PCa, with his brother having prostate cancer.    IPSS    Row Name 04/12/18 0800         International Prostate Symptom Score   How often have you had the sensation of not emptying your bladder?  About half the time     How often have you had to urinate less than every two hours?  About half the time     How often have you found you stopped and started again several times when you urinated?  Less than half the time     How often have you found it difficult to postpone urination?  More than half the time     How often have you had a weak urinary stream?  Less than half the time     How often have you had to strain to start urination?  Less than 1 in 5 times     How many times did you typically get up at night to urinate?  3 Times     Total IPSS Score  18       Quality of Life due to urinary symptoms   If you were to spend the rest of  your life with your urinary condition just the way it is now how would you feel about that?  Mixed        Score:  1-7 Mild 8-19 Moderate 20-35 Severe   PMH: Past Medical History:  Diagnosis Date  . Acute prostatitis   . BPH (benign prostatic hypertrophy)   . Dysphagia   . Elevated PSA   . Esophageal reflux   . Frequency   . HTN (hypertension)   . Nocturia   . Osteoarthritis   . Unilateral inguinal hernia   . Urgency of micturation     Surgical History: Past Surgical History:  Procedure Laterality Date  . CATARACT EXTRACTION Bilateral   . HERNIA REPAIR     bilateral inguinal  . LEG SURGERY     leg trauma as a child    Home Medications:  Allergies as of 04/12/2018      Reactions   Penicillin V Potassium Rash      Medication List       Accurate as of April 12, 2018  9:06 AM. Always use your most  recent med list.        aspirin EC 81 MG tablet Take 81 mg daily by mouth.   cetirizine 10 MG tablet Commonly known as:  ZYRTEC Take 10 mg as needed by mouth.   enalapril 20 MG tablet Commonly known as:  VASOTEC Take 20 mg daily by mouth.   Fish Oil 1000 MG Caps Take 1 capsule daily by mouth.   hydrochlorothiazide 25 MG tablet Commonly known as:  HYDRODIURIL Take 25 mg daily by mouth.   MULTI-VITAMINS Tabs Take 1 capsule daily by mouth.   nystatin-triamcinolone ointment Commonly known as:  MYCOLOG Apply 1 application topically 2 (two) times daily.   omeprazole 20 MG capsule Commonly known as:  PRILOSEC Take 20 mg daily by mouth.   tamsulosin 0.4 MG Caps capsule Commonly known as:  FLOMAX Take 1 capsule (0.4 mg total) by mouth daily.   traMADol 50 MG tablet Commonly known as:  ULTRAM Take 50 mg daily as needed by mouth.   triamcinolone lotion 0.1 % Commonly known as:  KENALOG Apply 1 application 2 (two) times daily as needed topically.   vitamin C 500 MG tablet Commonly known as:  ASCORBIC ACID Take by mouth.       Allergies:    Allergies  Allergen Reactions  . Penicillin V Potassium Rash    Family History: Family History  Problem Relation Age of Onset  . Colon cancer Unknown   . Diabetes Unknown   . Heart disease Unknown   . Skin cancer Unknown   . Prostate cancer Brother   . Kidney disease Neg Hx     Social History:  reports that he quit smoking about 24 years ago. He has never used smokeless tobacco. He reports that he does not drink alcohol or use drugs.  ROS: UROLOGY Frequent Urination?: No Hard to postpone urination?: Yes Burning/pain with urination?: No Get up at night to urinate?: Yes Leakage of urine?: No Urine stream starts and stops?: Yes Trouble starting stream?: No Do you have to strain to urinate?: No Blood in urine?: No Urinary tract infection?: No Sexually transmitted disease?: No Injury to kidneys or bladder?: No Painful intercourse?: No Weak stream?: No Erection problems?: No Penile pain?: No  Gastrointestinal Nausea?: No Vomiting?: No Indigestion/heartburn?: No Diarrhea?: No Constipation?: No  Constitutional Fever: No Night sweats?: No Weight loss?: No Fatigue?: No  Skin Skin rash/lesions?: Yes Itching?: No  Eyes Blurred vision?: No Double vision?: No  Ears/Nose/Throat Sore throat?: No Sinus problems?: No  Hematologic/Lymphatic Swollen glands?: No Easy bruising?: No  Cardiovascular Leg swelling?: No Chest pain?: No  Respiratory Cough?: No Shortness of breath?: No  Endocrine Excessive thirst?: No  Musculoskeletal Back pain?: No Joint pain?: No  Neurological Headaches?: No Dizziness?: No  Psychologic Depression?: No Anxiety?: No  Physical Exam: BP (!) 157/78   Pulse (!) 105   Ht 5\' 7"  (1.702 m)   Wt 170 lb (77.1 kg)   BMI 26.63 kg/m   Constitutional:  Well nourished. Alert and oriented, No acute distress. HEENT: [place] AT, moist mucus membranes.  Trachea midline, no masses. Cardiovascular: No clubbing, cyanosis, or  edema. Respiratory: Normal respiratory effort, no increased work of breathing. GI: Abdomen is soft, non tender, non distended, no abdominal masses. Liver and spleen not palpable.  No hernias appreciated.  Stool sample for occult testing is not indicated.   GU: No CVA tenderness.  No bladder fullness or masses.  Patient with uncircumcised phallus with a mild paraphimosis which reduces easily.  Head of glans and foreskin is erythematous.  Urethral meatus is patent.  No penile discharge. No penile lesions or rashes.  Rectal: Not examined. Lymph: No cervical or inguinal adenopathy. Neurologic: Grossly intact, no focal deficits, moving all 4 extremities. Psychiatric: Normal mood and affect.   Laboratory Data: PSA history  3.4 ng/mL on 08/08/2012  4.3 ng/mL on 03/01/2013  5.0 ng/mL on 04/01/2013  2.9 ng/mL on 08/29/2013  3.0 ng/mL on 03/01/2014  3.7 ng/mL on 03/02/2015 I have reviewed the labs.  Pertinent Imaging Results for Adam RussellFLORENCE, Tylor E (MRN [id number]) as of 04/12/2018 08:55  Ref. Range 04/12/2018 08:53  Scan Result Unknown 63ml    Assessment & Plan:    1. BPH with LUTS:   Patient's I PSS score is 18/3, it is stable.  We will continue to monitor. He will return in 1 year for IPSS score and exam. Most bothersome symptom is nocturia Continue tamsulosin 0.4 mg RTC in one months for I PSS and PVR   2. Nocturia He has not spoken with his PCP regarding a sleep study  3. Balanoposthitis Will prescribe Mycolog cream to apply twice daily after he cleans the head of his penis with soapy water and thoroughly dry the head of the penis and to make sure he brings his foreskin down over the head of his penis each time RTC in 2 weeks    Return in about 2 weeks (around 04/26/2018) for recheck .  Michiel CowboySHANNON Leonie Amacher, PA-C  [facility] [address] [place], [place] [address] [phone]

## 2018-04-12 ENCOUNTER — Encounter: Payer: Self-pay | Admitting: Urology

## 2018-04-12 ENCOUNTER — Ambulatory Visit (INDEPENDENT_AMBULATORY_CARE_PROVIDER_SITE_OTHER): Payer: Medicare HMO | Admitting: Urology

## 2018-04-12 VITALS — BP 157/78 | HR 105 | Ht 67.0 in | Wt 170.0 lb

## 2018-04-12 DIAGNOSIS — N138 Other obstructive and reflux uropathy: Secondary | ICD-10-CM | POA: Diagnosis not present

## 2018-04-12 DIAGNOSIS — N401 Enlarged prostate with lower urinary tract symptoms: Secondary | ICD-10-CM | POA: Diagnosis not present

## 2018-04-12 DIAGNOSIS — N476 Balanoposthitis: Secondary | ICD-10-CM

## 2018-04-12 DIAGNOSIS — R351 Nocturia: Secondary | ICD-10-CM

## 2018-04-12 LAB — BLADDER SCAN AMB NON-IMAGING

## 2018-04-12 MED ORDER — NYSTATIN-TRIAMCINOLONE 100000-0.1 UNIT/GM-% EX OINT: 1.0000 "application " | TOPICAL_OINTMENT | Freq: Two times a day (BID) | CUTANEOUS | 0 refills | Status: DC

## 2018-04-12 MED ORDER — TAMSULOSIN HCL 0.4 MG PO CAPS: 0.4000 mg | ORAL_CAPSULE | Freq: Every day | ORAL | 3 refills | Status: DC

## 2018-04-25 NOTE — Progress Notes (Signed)
04/26/2018 2:55 PM   Dorma Russell May 16, 1929 [id number]  Referring provider: Marina Goodell, MD [address] [place], [place] [address]  Chief Complaint  Patient presents with  . Benign Prostatic Hypertrophy    HPI: Patient is an 85 year old Caucasian male with a history of BPH with LU TS with bothersome symptoms of weak urinary stream, nocturia and balanoposthitis who presents today for a two week follow up.  He was noted to have significant balanoposthitis at his visit two weeks ago.  He was instructed on penile hygiene and prescribed Nystatin cream to apply twice daily.   He states his rash has completed cleared up.    PMH: Past Medical History:  Diagnosis Date  . Acute prostatitis   . BPH (benign prostatic hypertrophy)   . Dysphagia   . Elevated PSA   . Esophageal reflux   . Frequency   . HTN (hypertension)   . Nocturia   . Osteoarthritis   . Unilateral inguinal hernia   . Urgency of micturation     Surgical History: Past Surgical History:  Procedure Laterality Date  . CATARACT EXTRACTION Bilateral   . HERNIA REPAIR     bilateral inguinal  . LEG SURGERY     leg trauma as a child    Home Medications:  Allergies as of 04/26/2018      Reactions   Penicillin V Potassium Rash      Medication List       Accurate as of April 26, 2018  2:55 PM. Always use your most recent med list.        aspirin EC 81 MG tablet Take 81 mg daily by mouth.   cetirizine 10 MG tablet Commonly known as:  ZYRTEC Take 10 mg as needed by mouth.   enalapril 20 MG tablet Commonly known as:  VASOTEC Take 20 mg daily by mouth.   Fish Oil 1000 MG Caps Take 1 capsule daily by mouth.   hydrochlorothiazide 25 MG tablet Commonly known as:  HYDRODIURIL Take 25 mg daily by mouth.   MULTI-VITAMINS Tabs Take 1 capsule daily by mouth.   nystatin-triamcinolone ointment Commonly known as:  MYCOLOG Apply 1 application topically 2 (two) times daily.   omeprazole 20 MG  capsule Commonly known as:  PRILOSEC Take 20 mg daily by mouth.   tamsulosin 0.4 MG Caps capsule Commonly known as:  FLOMAX Take 1 capsule (0.4 mg total) by mouth daily.   traMADol 50 MG tablet Commonly known as:  ULTRAM Take 50 mg daily as needed by mouth.   triamcinolone lotion 0.1 % Commonly known as:  KENALOG Apply 1 application 2 (two) times daily as needed topically.   vitamin C 500 MG tablet Commonly known as:  ASCORBIC ACID Take by mouth.       Allergies:  Allergies  Allergen Reactions  . Penicillin V Potassium Rash    Family History: Family History  Problem Relation Age of Onset  . Colon cancer Other   . Diabetes Other   . Heart disease Other   . Skin cancer Other   . Prostate cancer Brother   . Kidney disease Neg Hx     Social History:  reports that he quit smoking about 24 years ago. He has never used smokeless tobacco. He reports that he does not drink alcohol or use drugs.  ROS: UROLOGY Frequent Urination?: No Hard to postpone urination?: No Burning/pain with urination?: No Get up at night to urinate?: No Leakage of urine?: No  Urine stream starts and stops?: Yes Trouble starting stream?: No Do you have to strain to urinate?: No Blood in urine?: No Urinary tract infection?: No Sexually transmitted disease?: No Injury to kidneys or bladder?: No Painful intercourse?: No Weak stream?: No Erection problems?: No Penile pain?: No  Gastrointestinal Nausea?: No Vomiting?: No Indigestion/heartburn?: No Diarrhea?: No Constipation?: No  Constitutional Fever: No Night sweats?: No Weight loss?: No Fatigue?: No  Skin Skin rash/lesions?: No Itching?: No  Eyes Blurred vision?: No Double vision?: No  Ears/Nose/Throat Sore throat?: No Sinus problems?: No  Hematologic/Lymphatic Swollen glands?: No Easy bruising?: No  Cardiovascular Leg swelling?: No Chest pain?: No  Respiratory Cough?: No Shortness of breath?:  No  Endocrine Excessive thirst?: No  Musculoskeletal Back pain?: No Joint pain?: No  Neurological Headaches?: No Dizziness?: No  Psychologic Depression?: No Anxiety?: No  Physical Exam: BP (!) 147/71   Pulse (!) 112   Temp (!) 95.7 F (35.4 C) (Oral)   Ht 5\' 7"  (1.702 m)   Wt 168 lb (76.2 kg)   BMI 26.31 kg/m   Constitutional:  Well nourished. Alert and oriented, No acute distress. HEENT: [place] AT, moist mucus membranes.  Trachea midline, no masses. Cardiovascular: No clubbing, cyanosis, or edema. Respiratory: Normal respiratory effort, no increased work of breathing. GU: No CVA tenderness.  No bladder fullness or masses.  Patient with uncircumcised phallus.  Foreskin easily retracted.  Urethral meatus is patent.  No penile discharge. No penile lesions or rashes.  Neurologic: Grossly intact, no focal deficits, moving all 4 extremities. Psychiatric: Normal mood and affect.   Laboratory Data: PSA history  3.4 ng/mL on 08/08/2012  4.3 ng/mL on 03/01/2013  5.0 ng/mL on 04/01/2013  2.9 ng/mL on 08/29/2013  3.0 ng/mL on 03/01/2014  3.7 ng/mL on 03/02/2015 I have reviewed the labs.  Pertinent Imaging Results for Dorma RussellFLORENCE, Hermilo E (MRN [id number]) as of 04/12/2018 08:55  Ref. Range 04/12/2018 08:53  Scan Result Unknown 63ml    Assessment & Plan:    1. BPH with LUTS:   Patient's I PSS score is 18/3, it is stable.  We will continue to monitor. He will return in 1 year for IPSS score and exam. Most bothersome symptom is nocturia Continue tamsulosin 0.4 mg RTC in one year for I PSS and PVR   2. Nocturia He has not spoken with his PCP regarding a sleep study  3. Balanoposthitis Resolved.  Return in about 1 year (around 04/27/2019) for IPSS, exam and PVR.  Michiel CowboySHANNON Dalia Jollie, PA-C  [facility] [address] [place], [place] [address] [phone]

## 2018-04-26 ENCOUNTER — Other Ambulatory Visit: Payer: Self-pay

## 2018-04-26 ENCOUNTER — Encounter: Payer: Self-pay | Admitting: Urology

## 2018-04-26 ENCOUNTER — Ambulatory Visit (INDEPENDENT_AMBULATORY_CARE_PROVIDER_SITE_OTHER): Payer: Medicare HMO | Admitting: Urology

## 2018-04-26 VITALS — BP 147/71 | HR 112 | Temp 95.7°F | Ht 67.0 in | Wt 168.0 lb

## 2018-04-26 DIAGNOSIS — N476 Balanoposthitis: Secondary | ICD-10-CM

## 2018-05-26 ENCOUNTER — Other Ambulatory Visit: Payer: Self-pay | Admitting: *Deleted

## 2018-05-26 MED ORDER — TAMSULOSIN HCL 0.4 MG PO CAPS: 0.4000 mg | ORAL_CAPSULE | Freq: Every day | ORAL | 3 refills | Status: DC

## 2018-05-26 NOTE — Telephone Encounter (Signed)
Refill Tamsulosin

## 2019-04-18 ENCOUNTER — Ambulatory Visit: Payer: Medicare HMO | Admitting: Urology

## 2019-04-24 NOTE — Progress Notes (Signed)
04/25/2019 2:11 PM   Adam [name] Apr 10, 1930 [id number]  Referring provider: Marina Goodell, MD [address] [place],  [place] [address]  Chief Complaint  Patient presents with  . Benign Prostatic Hypertrophy    HPI: Patient is an 86 year old male with BPH with LU TS with bothersome symptoms of weak urinary stream, nocturia and balanoposthitis who presents today for follow up.   BPH WITH LUTS  (prostate and/or bladder) IPSS score: 16/3     PVR: 34 mL   Previous score: 18/3   Previous PVR: 63 mL  Major complaint(s):  Frequency and nocturia.  Denies any dysuria, hematuria or suprapubic pain.   Currently taking: tamsulosin 0.4 mg according to our records, but he states he is not taking tamsulosin and doesn't think he had taken it in the past.  His biggest concern in his rash on his penis and scrotum.    Denies any recent fevers, chills, nausea or vomiting.  IPSS    Row Name 04/25/19 1300         International Prostate Symptom Score   How often have you had the sensation of not emptying your bladder?  About half the time     How often have you had to urinate less than every two hours?  About half the time     How often have you found you stopped and started again several times when you urinated?  Less than half the time     How often have you found it difficult to postpone urination?  Less than 1 in 5 times     How often have you had a weak urinary stream?  About half the time     How often have you had to strain to start urination?  Not at All     How many times did you typically get up at night to urinate?  4 Times     Total IPSS Score  16       Quality of Life due to urinary symptoms   If you were to spend the rest of your life with your urinary condition just the way it is now how would you feel about that?  Mixed        Score:  1-7 Mild 8-19 Moderate 20-35 Severe  He states that the rash is back on his penis.  He also has a rash on his scrotum.  He is  trying to keep it clean and it burns and itches sometimes.    PMH: Past Medical History:  Diagnosis Date  . Acute prostatitis   . BPH (benign prostatic hypertrophy)   . Dysphagia   . Elevated PSA   . Esophageal reflux   . Frequency   . HTN (hypertension)   . Nocturia   . Osteoarthritis   . Unilateral inguinal hernia   . Urgency of micturation     Surgical History: Past Surgical History:  Procedure Laterality Date  . CATARACT EXTRACTION Bilateral   . HERNIA REPAIR     bilateral inguinal  . LEG SURGERY     leg trauma as a child    Home Medications:  Allergies as of 04/25/2019      Reactions   Penicillin V Potassium Rash      Medication List       Accurate as of April 25, 2019  2:11 PM. If you have any questions, ask your nurse or doctor.        aspirin EC 81 MG  tablet Take 81 mg daily by mouth.   cetirizine 10 MG tablet Commonly known as: ZYRTEC Take 10 mg as needed by mouth.   enalapril 20 MG tablet Commonly known as: VASOTEC Take 20 mg daily by mouth.   Fish Oil 1000 MG Caps Take 1 capsule daily by mouth.   hydrochlorothiazide 25 MG tablet Commonly known as: HYDRODIURIL Take 25 mg daily by mouth.   Multi-Vitamins Tabs Take 1 capsule daily by mouth.   nystatin cream Commonly known as: MYCOSTATIN Apply 1 application topically 2 (two) times daily. Started by: Michiel Cowboy, PA-C   nystatin-triamcinolone ointment Commonly known as: MYCOLOG Apply 1 application topically 2 (two) times daily.   omeprazole 20 MG capsule Commonly known as: PRILOSEC Take 20 mg daily by mouth.   tamsulosin 0.4 MG Caps capsule Commonly known as: FLOMAX Take 1 capsule (0.4 mg total) by mouth daily.   tamsulosin 0.4 MG Caps capsule Commonly known as: FLOMAX Take 1 capsule (0.4 mg total) by mouth daily.   traMADol 50 MG tablet Commonly known as: ULTRAM Take 50 mg daily as needed by mouth.   triamcinolone lotion 0.1 % Commonly known as: KENALOG Apply 1  application 2 (two) times daily as needed topically.   vitamin C 500 MG tablet Commonly known as: ASCORBIC ACID Take by mouth.       Allergies:  Allergies  Allergen Reactions  . Penicillin V Potassium Rash    Family History: Family History  Problem Relation Age of Onset  . Colon cancer Other   . Diabetes Other   . Heart disease Other   . Skin cancer Other   . Prostate cancer Brother   . Kidney disease Neg Hx     Social History:  reports that he quit smoking about 25 years ago. He has never used smokeless tobacco. He reports that he does not drink alcohol or use drugs.  ROS: UROLOGY Frequent Urination?: Yes Hard to postpone urination?: No Burning/pain with urination?: No Get up at night to urinate?: Yes Leakage of urine?: No Urine stream starts and stops?: No Trouble starting stream?: No Do you have to strain to urinate?: No Blood in urine?: No Urinary tract infection?: No Sexually transmitted disease?: No Injury to kidneys or bladder?: No Painful intercourse?: No Weak stream?: No Erection problems?: No Penile pain?: No  Gastrointestinal Nausea?: No Vomiting?: No Indigestion/heartburn?: No Diarrhea?: No Constipation?: No  Constitutional Fever: No Night sweats?: No Weight loss?: No Fatigue?: No  Skin Skin rash/lesions?: No Itching?: No  Eyes Blurred vision?: No Double vision?: No  Ears/Nose/Throat Sore throat?: No Sinus problems?: No  Hematologic/Lymphatic Swollen glands?: No Easy bruising?: No  Cardiovascular Leg swelling?: No Chest pain?: No  Respiratory Cough?: No Shortness of breath?: No  Endocrine Excessive thirst?: No  Musculoskeletal Back pain?: No Joint pain?: No  Neurological Headaches?: No Dizziness?: No  Psychologic Depression?: No Anxiety?: No  Physical Exam: BP (!) 152/71   Pulse 97   Ht 5\' 7"  (1.702 m)   Wt 150 lb (68 kg)   BMI 23.49 kg/m   Constitutional:  Well nourished. Alert and oriented, No  acute distress. HEENT: [place] AT, moist mucus membranes.  Trachea midline, no masses. Cardiovascular: No clubbing, cyanosis, or edema. Respiratory: Normal respiratory effort, no increased work of breathing. GI: Abdomen is soft, non tender, non distended, no abdominal masses. Liver and spleen not palpable.  No hernias appreciated.  Stool sample for occult testing is not indicated.   GU: No CVA tenderness.  No  bladder fullness or masses.  Patient with uncircumcised phallus.  Foreskin easily retracted  Urethral meatus is patent.  No penile discharge.  10 mm x 5 mm well demarcated erythematous flat lesion suspicious for yeast balanitis appreciated.  Right scrotum without lesions, cysts, rashes and/or edema.  Left scrotum with erythematous, well demarcated rash.  Testicles are located scrotally bilaterally. No masses are appreciated in the testicles. Left and right epididymis are normal. Rectal: Not performed Skin: No rashes, bruises or suspicious lesions. Lymph: No inguinal adenopathy. Neurologic: Grossly intact, no focal deficits, moving all 4 extremities. Psychiatric: Normal mood and affect.  Laboratory Data: PSA history  3.4 ng/mL on 08/08/2012  4.3 ng/mL on 03/01/2013  5.0 ng/mL on 04/01/2013  2.9 ng/mL on 08/29/2013  3.0 ng/mL on 03/01/2014  3.7 ng/mL on 03/02/2015 I have reviewed the labs.  Pertinent Imaging Results for WALDEMAR, SIEGEL (MRN [id number]) as of 04/25/2019 14:29  Ref. Range 04/25/2019 13:37  Scan Result Unknown 34     Assessment & Plan:    1. BPH with LUTS IPSS score is 16/3, it is worsening Continue conservative management, avoiding bladder irritants and timed voiding's Most bothersome symptoms is/are related to his balanoposthitis  RTC in 12 months for IPSS, PVR and exam   2. Balanoposthitis  Prescribed nystatin cream to apply twice daily after pulling back the foreskin and cleaning the glans twice daily with soapy water and patting it dry Recheck in 2 weeks -  encouraged patient to keep his appointment in two weeks as malignancy of the penis may present in this fashion as well    Return in about 2 weeks (around 05/09/2019) for recheck .  Zara Council, PA-C  [facility] [address] [place], [place] [address] [phone]

## 2019-04-25 ENCOUNTER — Encounter: Payer: Self-pay | Admitting: Urology

## 2019-04-25 ENCOUNTER — Other Ambulatory Visit: Payer: Self-pay

## 2019-04-25 ENCOUNTER — Ambulatory Visit: Payer: Medicare HMO | Admitting: Urology

## 2019-04-25 VITALS — BP 152/71 | HR 97 | Ht 67.0 in | Wt 150.0 lb

## 2019-04-25 DIAGNOSIS — N476 Balanoposthitis: Secondary | ICD-10-CM

## 2019-04-25 DIAGNOSIS — N401 Enlarged prostate with lower urinary tract symptoms: Secondary | ICD-10-CM | POA: Diagnosis not present

## 2019-04-25 DIAGNOSIS — N138 Other obstructive and reflux uropathy: Secondary | ICD-10-CM | POA: Diagnosis not present

## 2019-04-25 LAB — BLADDER SCAN AMB NON-IMAGING: Scan Result: 34

## 2019-04-25 MED ORDER — NYSTATIN 100000 UNIT/GM EX CREA: 1.0000 "application " | TOPICAL_CREAM | Freq: Two times a day (BID) | CUTANEOUS | 0 refills | Status: DC

## 2019-05-08 NOTE — Progress Notes (Signed)
05/09/2019 2:12 PM   Adam [name] Mention 24-Jun-1929 [id number]  Referring provider: Sofie Hartigan, MD [place],  [place] [address]  Chief Complaint  Patient presents with  . Benign Prostatic Hypertrophy    HPI: Patient is an 86 year old male with BPH with LU TS with bothersome symptoms of weak urinary stream, nocturia and balanoposthitis who presents today for follow up.   He has been applying the Mycolog cream to his foreskin and penis twice daily.  He feels that it is improving.  The foreskin seems to be more pliable to him.  Patient denies any modifying or aggravating factors.  Patient denies any gross hematuria, dysuria or suprapubic/flank pain.  Patient denies any fevers, chills, nausea or vomiting.    PMH: Past Medical History:  Diagnosis Date  . Acute prostatitis   . BPH (benign prostatic hypertrophy)   . Dysphagia   . Elevated PSA   . Esophageal reflux   . Frequency   . HTN (hypertension)   . Nocturia   . Osteoarthritis   . Unilateral inguinal hernia   . Urgency of micturation     Surgical History: Past Surgical History:  Procedure Laterality Date  . CATARACT EXTRACTION Bilateral   . HERNIA REPAIR     bilateral inguinal  . LEG SURGERY     leg trauma as a child    Home Medications:  Allergies as of 05/09/2019      Reactions   Penicillin V Potassium Rash      Medication List       Accurate as of May 09, 2019  2:12 PM. If you have any questions, ask your nurse or doctor.        cetirizine 10 MG tablet Commonly known as: ZYRTEC Take 10 mg as needed by mouth.   enalapril 20 MG tablet Commonly known as: VASOTEC Take 20 mg daily by mouth.   Fish Oil 1000 MG Caps Take 1 capsule daily by mouth.   hydrochlorothiazide 25 MG tablet Commonly known as: HYDRODIURIL Take 25 mg daily by mouth.   Multi-Vitamins Tabs Take 1 capsule daily by mouth.   nystatin cream Commonly known as: MYCOSTATIN Apply 1 application topically 2 (two)  times daily.   nystatin-triamcinolone ointment Commonly known as: MYCOLOG Apply 1 application topically 2 (two) times daily.   omeprazole 20 MG capsule Commonly known as: PRILOSEC Take 20 mg daily by mouth.   triamcinolone lotion 0.1 % Commonly known as: KENALOG Apply 1 application 2 (two) times daily as needed topically.   vitamin C 500 MG tablet Commonly known as: ASCORBIC ACID Take by mouth.       Allergies:  Allergies  Allergen Reactions  . Penicillin V Potassium Rash    Family History: Family History  Problem Relation Age of Onset  . Colon cancer Other   . Diabetes Other   . Heart disease Other   . Skin cancer Other   . Prostate cancer Brother   . Kidney disease Neg Hx     Social History:  reports that he quit smoking about 25 years ago. He has never used smokeless tobacco. He reports that he does not drink alcohol or use drugs.  ROS: UROLOGY Frequent Urination?: No Hard to postpone urination?: No Burning/pain with urination?: No Get up at night to urinate?: No Leakage of urine?: No Urine stream starts and stops?: No Trouble starting stream?: No Do you have to strain to urinate?: No Blood in urine?: No Urinary tract infection?: No  Sexually transmitted disease?: No Injury to kidneys or bladder?: No Painful intercourse?: No Weak stream?: No Erection problems?: No Penile pain?: No  Gastrointestinal Nausea?: No Vomiting?: No Indigestion/heartburn?: No Diarrhea?: No Constipation?: No  Constitutional Fever: No Night sweats?: No Weight loss?: Yes Fatigue?: No  Skin Skin rash/lesions?: No Itching?: No  Eyes Blurred vision?: No Double vision?: No  Ears/Nose/Throat Sore throat?: No Sinus problems?: No  Hematologic/Lymphatic Swollen glands?: No Easy bruising?: No  Cardiovascular Leg swelling?: No Chest pain?: No  Respiratory Cough?: No Shortness of breath?: No  Endocrine Excessive thirst?: No  Musculoskeletal Back pain?: No  Joint pain?: No  Neurological Headaches?: No Dizziness?: No  Psychologic Depression?: No Anxiety?: No  Physical Exam: BP (!) 145/71   Pulse 94   Ht 5\' 7"  (1.702 m)   Wt 150 lb (68 kg)   BMI 23.49 kg/m   Constitutional:  Well nourished. Alert and oriented, No acute distress. HEENT: [employer] AT, mask in place.  Trachea midline, no masses. Cardiovascular: No clubbing, cyanosis, or edema. Respiratory: Normal respiratory effort, no increased work of breathing. GI: Abdomen is soft, non tender, non distended, no abdominal masses. Liver and spleen not palpable.  No hernias appreciated.  Stool sample for occult testing is not indicated.   GU: No CVA tenderness.  No bladder fullness or masses.  Patient with uncircumcised phallus.  Foreskin easily retracted   Urethral meatus is patent.  No penile discharge. Glans with slight erythema, moist and a whitish discharge is seen.  Right scrotum without lesions, cysts, rashes and/or edema.  Left scrotum with erythematous, well demarcated rash.   Testicles are located scrotally bilaterally. No masses are appreciated in the testicles. Left and right epididymis are normal. Rectal: Not performed Skin: No rashes, bruises or suspicious lesions. Lymph: No inguinal adenopathy. Neurologic: Grossly intact, no focal deficits, moving all 4 extremities.  Genu varum noted. Psychiatric: Normal mood and affect.  Laboratory Data: PSA history  3.4 ng/mL on 08/08/2012  4.3 ng/mL on 03/01/2013  5.0 ng/mL on 04/01/2013  2.9 ng/mL on 08/29/2013  3.0 ng/mL on 03/01/2014  3.7 ng/mL on 03/02/2015 I have reviewed the labs.  Pertinent Imaging Results for BABY, STAIRS (MRN Dorma Russell) as of 04/25/2019 14:29  Ref. Range 04/25/2019 13:37  Scan Result Unknown 34     Assessment & Plan:    1. Balanoposthitis  Continue the Mycolog cream for two more weeks as patient might be forgetful regarding cleaning and applying the cream and exam is still suspicious for yeast infection    Return in about 2 weeks (around 05/23/2019) for recheck .  07/21/2019, PA-C  [facility] [address] [place], [place] [phone]

## 2019-05-09 ENCOUNTER — Other Ambulatory Visit: Payer: Self-pay

## 2019-05-09 ENCOUNTER — Encounter: Payer: Self-pay | Admitting: Urology

## 2019-05-09 ENCOUNTER — Ambulatory Visit: Payer: Medicare HMO | Admitting: Urology

## 2019-05-09 VITALS — BP 145/71 | HR 94 | Ht 67.0 in | Wt 150.0 lb

## 2019-05-09 DIAGNOSIS — N476 Balanoposthitis: Secondary | ICD-10-CM

## 2019-05-23 ENCOUNTER — Ambulatory Visit: Payer: Medicare HMO | Admitting: Urology

## 2019-05-23 ENCOUNTER — Other Ambulatory Visit: Payer: Self-pay

## 2019-05-23 ENCOUNTER — Encounter: Payer: Self-pay | Admitting: Urology

## 2019-05-23 VITALS — BP 144/64 | HR 112 | Ht 67.0 in | Wt 150.0 lb

## 2019-05-23 DIAGNOSIS — N476 Balanoposthitis: Secondary | ICD-10-CM | POA: Diagnosis not present

## 2019-05-23 DIAGNOSIS — R21 Rash and other nonspecific skin eruption: Secondary | ICD-10-CM

## 2019-05-23 NOTE — Progress Notes (Signed)
05/23/2019 9:09 AM   Adam [name] May 02, 1929 [id number]  Referring provider: Marina Goodell, MD [address] [place],  [place] [address]  Chief Complaint  Patient presents with  . Benign Prostatic Hypertrophy    HPI: Patient is an 86 year old male with BPH with LU TS with bothersome symptoms of weak urinary stream, nocturia and balanoposthitis who presents today for follow up.   At his visit on 05/09/2019, he had been applying the Mycolog cream to his foreskin and penis twice daily.  He felt that it was improving.  The foreskin seems to be more pliable to him.  On exam, the glans was still with mucous and a whitish discharge.  My suspicion was he was not cleaning adequately.    He presents today for recheck.  He states he is experiencing frequency, dysuria and nocturia.  Patient denies any modifying or aggravating factors.  Patient denies any gross hematuria, dysuria or suprapubic/flank pain.  Patient denies any fevers, chills, nausea or vomiting.     PMH: Past Medical History:  Diagnosis Date  . Acute prostatitis   . BPH (benign prostatic hypertrophy)   . Dysphagia   . Elevated PSA   . Esophageal reflux   . Frequency   . HTN (hypertension)   . Nocturia   . Osteoarthritis   . Unilateral inguinal hernia   . Urgency of micturation     Surgical History: Past Surgical History:  Procedure Laterality Date  . CATARACT EXTRACTION Bilateral   . HERNIA REPAIR     bilateral inguinal  . LEG SURGERY     leg trauma as a child    Home Medications:  Allergies as of 05/23/2019      Reactions   Penicillin V Potassium Rash      Medication List       Accurate as of May 23, 2019  9:09 AM. If you have any questions, ask your nurse or doctor.        cetirizine 10 MG tablet Commonly known as: ZYRTEC Take 10 mg as needed by mouth.   enalapril 20 MG tablet Commonly known as: VASOTEC Take 20 mg daily by mouth.   Fish Oil 1000 MG Caps Take 1 capsule daily by  mouth.   hydrochlorothiazide 25 MG tablet Commonly known as: HYDRODIURIL Take 25 mg daily by mouth.   Multi-Vitamins Tabs Take 1 capsule daily by mouth.   nystatin cream Commonly known as: MYCOSTATIN Apply 1 application topically 2 (two) times daily.   nystatin-triamcinolone ointment Commonly known as: MYCOLOG Apply 1 application topically 2 (two) times daily.   omeprazole 20 MG capsule Commonly known as: PRILOSEC Take 20 mg daily by mouth.   triamcinolone lotion 0.1 % Commonly known as: KENALOG Apply 1 application 2 (two) times daily as needed topically.   vitamin C 500 MG tablet Commonly known as: ASCORBIC ACID Take by mouth.       Allergies:  Allergies  Allergen Reactions  . Penicillin V Potassium Rash    Family History: Family History  Problem Relation Age of Onset  . Colon cancer Other   . Diabetes Other   . Heart disease Other   . Skin cancer Other   . Prostate cancer Brother   . Kidney disease Neg Hx     Social History:  reports that he quit smoking about 25 years ago. He has never used smokeless tobacco. He reports that he does not drink alcohol or use drugs.  ROS: UROLOGY Frequent Urination?: Yes  Hard to postpone urination?: No Burning/pain with urination?: Yes Get up at night to urinate?: Yes Leakage of urine?: No Urine stream starts and stops?: No Trouble starting stream?: No Do you have to strain to urinate?: No Blood in urine?: No Urinary tract infection?: No Sexually transmitted disease?: No Injury to kidneys or bladder?: No Painful intercourse?: No Weak stream?: No Erection problems?: No Penile pain?: No  Gastrointestinal Nausea?: No Vomiting?: No Indigestion/heartburn?: No Diarrhea?: No Constipation?: No  Constitutional Fever: No Night sweats?: No Weight loss?: Yes Fatigue?: No  Skin Skin rash/lesions?: No Itching?: No  Eyes Blurred vision?: No Double vision?: No  Ears/Nose/Throat Sore throat?: No Sinus  problems?: No  Hematologic/Lymphatic Swollen glands?: No Easy bruising?: No  Cardiovascular Leg swelling?: No Chest pain?: No  Respiratory Cough?: No Shortness of breath?: No  Endocrine Excessive thirst?: No  Musculoskeletal Back pain?: No Joint pain?: No  Neurological Headaches?: No Dizziness?: No  Psychologic Depression?: No Anxiety?: No  Physical Exam: BP (!) 144/64   Pulse (!) 112   Ht 5\' 7"  (1.702 m)   Wt 150 lb (68 kg)   BMI 23.49 kg/m   Constitutional:  Well nourished. Alert and oriented, No acute distress. HEENT: [place] AT, mask in place.  Trachea midline, no masses. Cardiovascular: No clubbing, cyanosis, or edema. Respiratory: Normal respiratory effort, no increased work of breathing. GI: Abdomen is soft, non tender, non distended, no abdominal masses. Liver and spleen not palpable.  No hernias appreciated.  Stool sample for occult testing is not indicated.   GU: No CVA tenderness.  No bladder fullness or masses.  Patient with uncircumcised phallus.  Foreskin easily retracted.   There is some adhesion to the glans ventrally.   Urethral meatus is patent.  No penile discharge. No penile lesions or rashes.  Right scrotum without lesions, cysts, rashes and/or edema.  Left scrotum with erythematous, well demarcated rash. Patient states it has been present for a long time. Testicles are located scrotally bilaterally. No masses are appreciated in the testicles. Left and right epididymis are normal. Rectal: Not indicated  Skin: No rashes, bruises or suspicious lesions. Lymph: No inguinal adenopathy. Neurologic: Grossly intact, no focal deficits, moving all 4 extremities. Psychiatric: Normal mood and affect.   Laboratory Data: PSA history  3.4 ng/mL on 08/08/2012  4.3 ng/mL on 03/01/2013  5.0 ng/mL on 04/01/2013  2.9 ng/mL on 08/29/2013  3.0 ng/mL on 03/01/2014  3.7 ng/mL on 03/02/2015 I have reviewed the labs.  Pertinent Imaging Results for Adam [name], Adam [name]  (MRN [id number]) as of 04/25/2019 14:29  Ref. Range 04/25/2019 13:37  Scan Result Unknown 34     Assessment & Plan:    1. Balanoposthitis  Resolved  2. Scrotal rash States it is been present for a while, will mention it to his dermatologist next week  Return if symptoms worsen or fail to improve.  Zara Council, PA-C  [facility] [address] [place], [place] [address] [phone]
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
## 2019-07-12 ENCOUNTER — Other Ambulatory Visit: Payer: Self-pay | Admitting: Urology

## 2020-01-24 ENCOUNTER — Ambulatory Visit: Payer: Medicare HMO | Attending: Internal Medicine

## 2020-01-24 ENCOUNTER — Other Ambulatory Visit: Payer: Self-pay | Admitting: Internal Medicine

## 2020-01-24 DIAGNOSIS — Z23 Encounter for immunization: Secondary | ICD-10-CM

## 2020-01-24 NOTE — Progress Notes (Signed)
   Covid-19 Vaccination Clinic  Name:  Adam [name]    MRN: [id number] DOB: [birth year]  01/24/2020  [name] was observed post Covid-19 immunization for 15 minutes without incident. He was provided with Vaccine Information Sheet and instruction to access the V-Safe system.   [name] was instructed to call 911 with any severe reactions post vaccine: Marland Kitchen Difficulty breathing  . Swelling of face and throat  . A fast heartbeat  . A bad rash all over body  . Dizziness and weakness

## 2020-05-25 NOTE — Progress Notes (Deleted)
05/28/2020 8:22 AM   Ernestene Mention 12-30-1929 [id number]  Referring provider: Sofie Hartigan, [place],  [place] [address]  No chief complaint on file.  Urological history: 1. BPH with LU TS - 3.7 in 2016 - screening discontinued due to age - I PSS *** - PVR ***  2.Balanoposthitis  - managed with Mycolog cream   HPI: Adam [name] is a 87 y.o. male who presents today for a yearly follow up.      PMH: Past Medical History:  Diagnosis Date  . Acute prostatitis   . BPH (benign prostatic hypertrophy)   . Dysphagia   . Elevated PSA   . Esophageal reflux   . Frequency   . HTN (hypertension)   . Nocturia   . Osteoarthritis   . Unilateral inguinal hernia   . Urgency of micturation     Surgical History: Past Surgical History:  Procedure Laterality Date  . CATARACT EXTRACTION Bilateral   . HERNIA REPAIR     bilateral inguinal  . LEG SURGERY     leg trauma as a child    Home Medications:  Allergies as of 05/28/2020      Reactions   Penicillin V Potassium Rash      Medication List       Accurate as of May 25, 2020  8:22 AM. If you have any questions, ask your nurse or doctor.        cetirizine 10 MG tablet Commonly known as: ZYRTEC Take 10 mg as needed by mouth.   enalapril 20 MG tablet Commonly known as: VASOTEC Take 20 mg daily by mouth.   Fish Oil 1000 MG Caps Take 1 capsule daily by mouth.   hydrochlorothiazide 25 MG tablet Commonly known as: HYDRODIURIL Take 25 mg daily by mouth.   Multi-Vitamins Tabs Take 1 capsule daily by mouth.   nystatin cream Commonly known as: MYCOSTATIN Apply 1 application topically 2 (two) times daily.   nystatin-triamcinolone ointment Commonly known as: MYCOLOG Apply 1 application topically 2 (two) times daily.   omeprazole 20 MG capsule Commonly known as: PRILOSEC Take 20 mg daily by mouth.   triamcinolone lotion 0.1 % Commonly known as: KENALOG Apply 1 application 2  (two) times daily as needed topically.   vitamin C 500 MG tablet Commonly known as: ASCORBIC ACID Take by mouth.       Allergies:  Allergies  Allergen Reactions  . Penicillin V Potassium Rash    Family History: Family History  Problem Relation Age of Onset  . Colon cancer Other   . Diabetes Other   . Heart disease Other   . Skin cancer Other   . Prostate cancer Brother   . Kidney disease Neg Hx     Social History:  reports that he quit smoking about 26 years ago. He has never used smokeless tobacco. He reports that he does not drink alcohol and does not use drugs.  For pertinent review of systems please refer to history of present illness  Physical Exam: There were no vitals taken for this visit.  Constitutional:  Well nourished. Alert and oriented, No acute distress. HEENT: [place] AT, moist mucus membranes.  Trachea midline Cardiovascular: No clubbing, cyanosis, or edema. Respiratory: Normal respiratory effort, no increased work of breathing. GI: Abdomen is soft, non tender, non distended, no abdominal masses. Liver and spleen not palpable.  No hernias appreciated.  Stool sample for occult testing is not indicated.   GU: No  CVA tenderness.  No bladder fullness or masses.  Patient with circumcised/uncircumcised phallus. ***Foreskin easily retracted***  Urethral meatus is patent.  No penile discharge. No penile lesions or rashes. Scrotum without lesions, cysts, rashes and/or edema.  Testicles are located scrotally bilaterally. No masses are appreciated in the testicles. Left and right epididymis are normal. Rectal: Patient with  normal sphincter tone. Anus and perineum without scarring or rashes. No rectal masses are appreciated. Prostate is approximately *** grams, *** nodules are appreciated. Seminal vesicles are normal. Skin: No rashes, bruises or suspicious lesions. Lymph: No inguinal adenopathy. Neurologic: Grossly intact, no focal deficits, moving all 4  extremities. Psychiatric: Normal mood and affect.   Laboratory Data: PSA history  3.4 ng/mL on 08/08/2012  4.3 ng/mL on 03/01/2013  5.0 ng/mL on 04/01/2013  2.9 ng/mL on 08/29/2013  3.0 ng/mL on 03/01/2014  3.7 ng/mL on 03/02/2015 Specimen:  Blood  Ref Range & Units 2 mo ago  Glucose 70 - 110 mg/dL 94   Sodium 136 - 145 mmol/L 137   Potassium 3.6 - 5.1 mmol/L 4.2   Chloride 97 - 109 mmol/L 100   Carbon Dioxide (CO2) 22.0 - 32.0 mmol/L 29.2   Urea Nitrogen (BUN) 7 - 25 mg/dL 28High   Creatinine 0.7 - 1.3 mg/dL 1.4High   Glomerular Filtration Rate (eGFR), MDRD Estimate >60 mL/min/1.73sq m 48Low   Calcium 8.7 - 10.3 mg/dL 9.9   AST  8 - 39 U/L 21   ALT  6 - 57 U/L 17   Alk Phos (alkaline Phosphatase) 34 - 104 U/L 82   Albumin 3.5 - 4.8 g/dL 4.1   Bilirubin, Total 0.3 - 1.2 mg/dL 1.1   Protein, Total 6.1 - 7.9 g/dL 6.5   A/G Ratio 1.0 - 5.0 gm/dL 1.7   Resulting Agency  [facility] - [facility]  Specimen Collected: 02/29/20 8:19 AM Last Resulted: 02/29/20 2:20 PM  Received From: [facility]  Result Received: 05/25/20 8:22 AM   Specimen:  Blood  Ref Range & Units 2 mo ago  Cholesterol, Total 100 - 200 mg/dL 129   Triglyceride 35 - 199 mg/dL 56   HDL (High Density Lipoprotein) Cholesterol 29.0 - 71.0 mg/dL 62.8   LDL Calculated 0 - 130 mg/dL 55   VLDL Cholesterol mg/dL 11   Cholesterol/HDL Ratio  2.1   [facility] - [facility]  Specimen Collected: 02/29/20 8:19 AM Last Resulted: 02/29/20 2:20 PM  Received From: [facility]  Result Received: 05/25/20 8:22 AM   Specimen:  Blood  Ref Range & Units 2 mo ago  Hemoglobin A1C 4.2 - 5.6 % 6.3High   Average Blood Glucose (Calc) mg/dL 134   Resulting Agency  [facility] - [facility]   Narrative  Normal Range:  4.2 - 5.6%  Increased Risk: 5.7 - 6.4%  Diabetes:    >= 6.5%  Glycemic Control for adults with diabetes: <7%  Specimen Collected:  02/29/20 8:19 AM Last Resulted: 02/29/20 1:34 PM  Received From: [facility]  Result Received: 05/25/20 8:22 AM   Specimen:  Blood  Ref Range & Units 2 mo ago  Thyroid Stimulating Hormone (TSH) 0.450-5.330 uIU/ml uIU/mL 1.023   Resulting Agency  [facility]  Specimen Collected: 02/29/20 8:19 AM Last Resulted: 02/29/20 12:19 PM  Received From: [facility]  Result Received: 05/25/20 8:22 AM   Specimen:  Blood  Ref Range & Units 2 mo ago  WBC (White Blood Cell Count) 4.1 - 10.2 10^3/uL 7.0   RBC (Red Blood Cell Count) 4.69 - 6.13 10^6/uL 4.49Low   Hemoglobin 14.1 - 18.1 gm/dL 14.8   Hematocrit 40.0 - 52.0 % 42.8   MCV (Mean Corpuscular Volume) 80.0 - 100.0 fl 95.3   MCH (Mean Corpuscular Hemoglobin) 27.0 - 31.2 pg 33.0High   MCHC (Mean Corpuscular Hemoglobin Concentration) 32.0 - 36.0 gm/dL 34.6   Platelet Count 150 - 450 10^3/uL 151   RDW-CV (Red Cell Distribution Width) 11.6 - 14.8 % 11.9   MPV (Mean Platelet Volume) 9.4 - 12.4 fl 10.3   Neutrophils 1.50 - 7.80 10^3/uL 5.00   Lymphocytes 1.00 - 3.60 10^3/uL 1.20   Mixed Count 0.10 - 0.90 10^3/uL 0.80   Neutrophil % 32.0 - 70.0 % 71.5High   Lymphocyte % 10.0 - 50.0 % 16.5   Mixed % 3.0 - 14.4 % 12.0   Resulting Agency  [facility] - [facility]  Specimen Collected: 02/29/20 8:19 AM Last Resulted: 02/29/20 10:15 AM  Received From: [facility]  Result Received: 05/25/20 8:22 AM  I have reviewed the labs.  Pertinent Imaging ***  Assessment & Plan:    1. BPH with LUTS IPSS score is ***, it is stable/improving/worsening Continue conservative management, avoiding bladder irritants and timed voiding's Most bothersome symptoms is/are *** Initiate alpha-blocker (***), discussed side effects *** Initiate 5 alpha reductase inhibitor (***), discussed side effects *** Continue tamsulosin 0.4 mg daily, alfuzosin 10 mg daily, Rapaflo 8 mg daily, terazosin,  doxazosin, Cialis 5 mg daily and finasteride 5 mg daily, dutasteride 0.5 mg daily***:refills given Cannot tolerate medication or medication failure, schedule cystoscopy *** RTC in *** months for IPSS, PSA, PVR and exam     No follow-ups on file.  Zara Council, PA-C  [facility] [address] [place], [place] [address] [phone]

## 2020-05-28 ENCOUNTER — Ambulatory Visit: Payer: Medicare HMO | Admitting: Urology

## 2020-05-28 DIAGNOSIS — N138 Other obstructive and reflux uropathy: Secondary | ICD-10-CM

## 2021-05-14 ENCOUNTER — Other Ambulatory Visit: Payer: Self-pay

## 2021-05-16 ENCOUNTER — Ambulatory Visit: Payer: Medicare PPO | Attending: Internal Medicine

## 2021-05-16 ENCOUNTER — Other Ambulatory Visit: Payer: Self-pay

## 2021-05-16 DIAGNOSIS — Z23 Encounter for immunization: Secondary | ICD-10-CM

## 2021-05-16 MED ORDER — PFIZER COVID-19 VAC BIVALENT 30 MCG/0.3ML IM SUSP
INTRAMUSCULAR | 0 refills | Status: DC
  Filled 2021-05-16: qty 0.3, 1d supply, fill #0

## 2021-05-16 NOTE — Progress Notes (Signed)
° °  Covid-19 Vaccination Clinic  Name:  DIONDRE PULIS    MRN: [id number] DOB: Aug 21, 1929  05/16/2021  Mr. Grandison was observed post Covid-19 immunization for 15 minutes without incident. He was provided with Vaccine Information Sheet and instruction to access the V-Safe system.   Mr. Kooi was instructed to call 911 with any severe reactions post vaccine: Difficulty breathing  Swelling of face and throat  A fast heartbeat  A bad rash all over body  Dizziness and weakness   Immunizations Administered     Name Date Dose VIS Date Route   Pfizer Covid-19 Vaccine Bivalent Booster 05/16/2021  9:39 AM 0.3 mL 12/19/2020 Intramuscular   Manufacturer: [employer], [employer]   Lot: [id number]   NDC: [phone]       Covid-19 Vaccination Clinic  Name:  MONTE ZINNI    MRN: [id number] DOB: [birth year]  05/16/2021  Mr. Creps was observed post Covid-19 immunization for 15 minutes without incident. He was provided with Vaccine Information Sheet and instruction to access the V-Safe system.   Mr. Cho was instructed to call 911 with any severe reactions post vaccine: Difficulty breathing  Swelling of face and throat  A fast heartbeat  A bad rash all over body  Dizziness and weakness   Immunizations Administered     Name Date Dose VIS Date Route   Pfizer Covid-19 Vaccine Bivalent Booster 05/16/2021  9:39 AM 0.3 mL 12/19/2020 Intramuscular   Manufacturer: [employer], [employer]   Lot: [id number]   NDC: [phone]

## 2022-04-22 ENCOUNTER — Other Ambulatory Visit (HOSPITAL_COMMUNITY): Payer: Self-pay

## 2022-04-23 ENCOUNTER — Other Ambulatory Visit (HOSPITAL_COMMUNITY): Payer: Self-pay

## 2022-04-24 ENCOUNTER — Other Ambulatory Visit (HOSPITAL_COMMUNITY): Payer: Self-pay

## 2022-04-24 MED ORDER — ATORVASTATIN CALCIUM 10 MG PO TABS
10.0000 mg | ORAL_TABLET | Freq: Every day | ORAL | 3 refills | Status: DC
  Filled 2022-08-18: qty 90, 90d supply, fill #0
  Filled 2022-12-12: qty 90, 90d supply, fill #1

## 2022-04-24 MED ORDER — RISPERIDONE 0.25 MG PO TABS: 0.2500 mg | ORAL_TABLET | Freq: Every day | ORAL | 1 refills | Status: DC

## 2022-04-25 ENCOUNTER — Other Ambulatory Visit (HOSPITAL_COMMUNITY): Payer: Self-pay

## 2022-04-26 ENCOUNTER — Other Ambulatory Visit (HOSPITAL_COMMUNITY): Payer: Self-pay

## 2022-04-28 ENCOUNTER — Other Ambulatory Visit (HOSPITAL_COMMUNITY): Payer: Self-pay

## 2022-05-06 ENCOUNTER — Other Ambulatory Visit (HOSPITAL_COMMUNITY): Payer: Self-pay

## 2022-05-07 ENCOUNTER — Other Ambulatory Visit (HOSPITAL_COMMUNITY): Payer: Self-pay

## 2022-05-07 ENCOUNTER — Other Ambulatory Visit: Payer: Self-pay

## 2022-05-07 DIAGNOSIS — E119 Type 2 diabetes mellitus without complications: Secondary | ICD-10-CM | POA: Diagnosis not present

## 2022-05-07 DIAGNOSIS — F02B4 Dementia in other diseases classified elsewhere, moderate, with anxiety: Secondary | ICD-10-CM | POA: Diagnosis not present

## 2022-05-07 DIAGNOSIS — G43909 Migraine, unspecified, not intractable, without status migrainosus: Secondary | ICD-10-CM | POA: Diagnosis not present

## 2022-05-07 DIAGNOSIS — G309 Alzheimer's disease, unspecified: Secondary | ICD-10-CM | POA: Diagnosis not present

## 2022-05-07 DIAGNOSIS — K5790 Diverticulosis of intestine, part unspecified, without perforation or abscess without bleeding: Secondary | ICD-10-CM | POA: Diagnosis not present

## 2022-05-07 DIAGNOSIS — I10 Essential (primary) hypertension: Secondary | ICD-10-CM | POA: Diagnosis not present

## 2022-05-07 DIAGNOSIS — F02B2 Dementia in other diseases classified elsewhere, moderate, with psychotic disturbance: Secondary | ICD-10-CM | POA: Diagnosis not present

## 2022-05-07 MED ORDER — RISPERIDONE 0.25 MG PO TABS
0.2500 mg | ORAL_TABLET | Freq: Every day | ORAL | 1 refills | Status: DC
  Filled 2022-05-07: qty 90, 90d supply, fill #0
  Filled 2022-08-18: qty 90, 90d supply, fill #1

## 2022-05-07 MED ORDER — MEMANTINE HCL 10 MG PO TABS
10.0000 mg | ORAL_TABLET | Freq: Two times a day (BID) | ORAL | 1 refills | Status: DC
  Filled 2022-05-07 – 2022-05-13 (×2): qty 180, 90d supply, fill #0
  Filled 2022-08-17: qty 180, 90d supply, fill #1

## 2022-05-13 ENCOUNTER — Other Ambulatory Visit (HOSPITAL_COMMUNITY): Payer: Self-pay

## 2022-05-13 ENCOUNTER — Other Ambulatory Visit: Payer: Self-pay

## 2022-05-14 ENCOUNTER — Other Ambulatory Visit (HOSPITAL_COMMUNITY): Payer: Self-pay

## 2022-05-14 MED ORDER — ENALAPRIL MALEATE 2.5 MG PO TABS
2.5000 mg | ORAL_TABLET | Freq: Every day | ORAL | 1 refills | Status: DC
  Filled 2022-05-14: qty 90, 90d supply, fill #0
  Filled 2022-08-18: qty 90, 90d supply, fill #1

## 2022-05-15 ENCOUNTER — Other Ambulatory Visit (HOSPITAL_COMMUNITY): Payer: Self-pay

## 2022-06-04 DIAGNOSIS — E1122 Type 2 diabetes mellitus with diabetic chronic kidney disease: Secondary | ICD-10-CM | POA: Diagnosis not present

## 2022-06-04 DIAGNOSIS — N1831 Chronic kidney disease, stage 3a: Secondary | ICD-10-CM | POA: Diagnosis not present

## 2022-06-04 DIAGNOSIS — N400 Enlarged prostate without lower urinary tract symptoms: Secondary | ICD-10-CM | POA: Diagnosis not present

## 2022-06-04 DIAGNOSIS — E440 Moderate protein-calorie malnutrition: Secondary | ICD-10-CM | POA: Diagnosis not present

## 2022-06-04 DIAGNOSIS — R2681 Unsteadiness on feet: Secondary | ICD-10-CM | POA: Diagnosis not present

## 2022-06-04 DIAGNOSIS — E7800 Pure hypercholesterolemia, unspecified: Secondary | ICD-10-CM | POA: Diagnosis not present

## 2022-06-04 DIAGNOSIS — F0390 Unspecified dementia without behavioral disturbance: Secondary | ICD-10-CM | POA: Diagnosis not present

## 2022-06-04 DIAGNOSIS — N401 Enlarged prostate with lower urinary tract symptoms: Secondary | ICD-10-CM | POA: Diagnosis not present

## 2022-06-04 DIAGNOSIS — G309 Alzheimer's disease, unspecified: Secondary | ICD-10-CM | POA: Diagnosis not present

## 2022-06-04 DIAGNOSIS — M1990 Unspecified osteoarthritis, unspecified site: Secondary | ICD-10-CM | POA: Diagnosis not present

## 2022-06-04 DIAGNOSIS — I10 Essential (primary) hypertension: Secondary | ICD-10-CM | POA: Diagnosis not present

## 2022-06-04 DIAGNOSIS — F02818 Dementia in other diseases classified elsewhere, unspecified severity, with other behavioral disturbance: Secondary | ICD-10-CM | POA: Diagnosis not present

## 2022-06-04 DIAGNOSIS — Z87891 Personal history of nicotine dependence: Secondary | ICD-10-CM | POA: Diagnosis not present

## 2022-06-04 DIAGNOSIS — K219 Gastro-esophageal reflux disease without esophagitis: Secondary | ICD-10-CM | POA: Diagnosis not present

## 2022-06-04 DIAGNOSIS — E785 Hyperlipidemia, unspecified: Secondary | ICD-10-CM | POA: Diagnosis not present

## 2022-06-06 DIAGNOSIS — N1831 Chronic kidney disease, stage 3a: Secondary | ICD-10-CM | POA: Diagnosis not present

## 2022-06-06 DIAGNOSIS — I10 Essential (primary) hypertension: Secondary | ICD-10-CM | POA: Diagnosis not present

## 2022-06-06 DIAGNOSIS — G309 Alzheimer's disease, unspecified: Secondary | ICD-10-CM | POA: Diagnosis not present

## 2022-06-06 DIAGNOSIS — E440 Moderate protein-calorie malnutrition: Secondary | ICD-10-CM | POA: Diagnosis not present

## 2022-06-06 DIAGNOSIS — E871 Hypo-osmolality and hyponatremia: Secondary | ICD-10-CM | POA: Diagnosis not present

## 2022-06-06 DIAGNOSIS — F02818 Dementia in other diseases classified elsewhere, unspecified severity, with other behavioral disturbance: Secondary | ICD-10-CM | POA: Diagnosis not present

## 2022-06-06 DIAGNOSIS — E1122 Type 2 diabetes mellitus with diabetic chronic kidney disease: Secondary | ICD-10-CM | POA: Diagnosis not present

## 2022-06-17 DIAGNOSIS — L570 Actinic keratosis: Secondary | ICD-10-CM | POA: Diagnosis not present

## 2022-06-17 DIAGNOSIS — D485 Neoplasm of uncertain behavior of skin: Secondary | ICD-10-CM | POA: Diagnosis not present

## 2022-06-17 DIAGNOSIS — C44229 Squamous cell carcinoma of skin of left ear and external auricular canal: Secondary | ICD-10-CM | POA: Diagnosis not present

## 2022-06-17 DIAGNOSIS — Z872 Personal history of diseases of the skin and subcutaneous tissue: Secondary | ICD-10-CM | POA: Diagnosis not present

## 2022-06-17 DIAGNOSIS — Z859 Personal history of malignant neoplasm, unspecified: Secondary | ICD-10-CM | POA: Diagnosis not present

## 2022-06-17 DIAGNOSIS — L578 Other skin changes due to chronic exposure to nonionizing radiation: Secondary | ICD-10-CM | POA: Diagnosis not present

## 2022-06-17 DIAGNOSIS — C44329 Squamous cell carcinoma of skin of other parts of face: Secondary | ICD-10-CM | POA: Diagnosis not present

## 2022-06-17 DIAGNOSIS — Z85828 Personal history of other malignant neoplasm of skin: Secondary | ICD-10-CM | POA: Diagnosis not present

## 2022-08-13 DIAGNOSIS — H52223 Regular astigmatism, bilateral: Secondary | ICD-10-CM | POA: Diagnosis not present

## 2022-08-13 DIAGNOSIS — H524 Presbyopia: Secondary | ICD-10-CM | POA: Diagnosis not present

## 2022-08-13 DIAGNOSIS — Z961 Presence of intraocular lens: Secondary | ICD-10-CM | POA: Diagnosis not present

## 2022-08-13 DIAGNOSIS — H5203 Hypermetropia, bilateral: Secondary | ICD-10-CM | POA: Diagnosis not present

## 2022-08-18 ENCOUNTER — Other Ambulatory Visit (HOSPITAL_COMMUNITY): Payer: Self-pay

## 2022-09-16 DIAGNOSIS — C44320 Squamous cell carcinoma of skin of unspecified parts of face: Secondary | ICD-10-CM | POA: Diagnosis not present

## 2022-09-17 ENCOUNTER — Other Ambulatory Visit: Payer: Self-pay

## 2022-09-17 ENCOUNTER — Other Ambulatory Visit (HOSPITAL_COMMUNITY): Payer: Self-pay

## 2022-09-17 DIAGNOSIS — R29898 Other symptoms and signs involving the musculoskeletal system: Secondary | ICD-10-CM | POA: Diagnosis not present

## 2022-09-17 DIAGNOSIS — F02B18 Dementia in other diseases classified elsewhere, moderate, with other behavioral disturbance: Secondary | ICD-10-CM | POA: Diagnosis not present

## 2022-09-17 DIAGNOSIS — R262 Difficulty in walking, not elsewhere classified: Secondary | ICD-10-CM | POA: Diagnosis not present

## 2022-09-17 DIAGNOSIS — G309 Alzheimer's disease, unspecified: Secondary | ICD-10-CM | POA: Diagnosis not present

## 2022-09-17 MED ORDER — RISPERIDONE 0.25 MG PO TABS
0.2500 mg | ORAL_TABLET | Freq: Every day | ORAL | 1 refills | Status: DC
  Filled 2022-09-17: qty 90, 90d supply, fill #0

## 2022-09-17 MED ORDER — MIRTAZAPINE 15 MG PO TABS
15.0000 mg | ORAL_TABLET | Freq: Every day | ORAL | 1 refills | Status: DC
  Filled 2022-09-17: qty 90, 90d supply, fill #0

## 2022-09-17 MED ORDER — DONEPEZIL HCL 10 MG PO TABS
10.0000 mg | ORAL_TABLET | Freq: Every day | ORAL | 1 refills | Status: DC
  Filled 2022-09-17: qty 90, 90d supply, fill #0

## 2022-09-17 MED ORDER — MEMANTINE HCL 10 MG PO TABS
10.0000 mg | ORAL_TABLET | Freq: Two times a day (BID) | ORAL | 1 refills | Status: DC
  Filled 2022-09-17 – 2022-11-23 (×2): qty 180, 90d supply, fill #0

## 2022-09-18 ENCOUNTER — Other Ambulatory Visit: Payer: Self-pay

## 2022-10-13 ENCOUNTER — Other Ambulatory Visit (HOSPITAL_COMMUNITY): Payer: Self-pay

## 2022-10-20 DIAGNOSIS — F02818 Dementia in other diseases classified elsewhere, unspecified severity, with other behavioral disturbance: Secondary | ICD-10-CM | POA: Diagnosis not present

## 2022-10-20 DIAGNOSIS — E1122 Type 2 diabetes mellitus with diabetic chronic kidney disease: Secondary | ICD-10-CM | POA: Diagnosis not present

## 2022-10-20 DIAGNOSIS — N401 Enlarged prostate with lower urinary tract symptoms: Secondary | ICD-10-CM | POA: Diagnosis not present

## 2022-10-20 DIAGNOSIS — E7800 Pure hypercholesterolemia, unspecified: Secondary | ICD-10-CM | POA: Diagnosis not present

## 2022-10-20 DIAGNOSIS — R2681 Unsteadiness on feet: Secondary | ICD-10-CM | POA: Diagnosis not present

## 2022-10-20 DIAGNOSIS — E440 Moderate protein-calorie malnutrition: Secondary | ICD-10-CM | POA: Diagnosis not present

## 2022-10-20 DIAGNOSIS — K219 Gastro-esophageal reflux disease without esophagitis: Secondary | ICD-10-CM | POA: Diagnosis not present

## 2022-10-20 DIAGNOSIS — N1831 Chronic kidney disease, stage 3a: Secondary | ICD-10-CM | POA: Diagnosis not present

## 2022-10-20 DIAGNOSIS — G309 Alzheimer's disease, unspecified: Secondary | ICD-10-CM | POA: Diagnosis not present

## 2022-10-20 DIAGNOSIS — I10 Essential (primary) hypertension: Secondary | ICD-10-CM | POA: Diagnosis not present

## 2022-11-06 ENCOUNTER — Other Ambulatory Visit (HOSPITAL_COMMUNITY): Payer: Self-pay

## 2022-11-06 DIAGNOSIS — R413 Other amnesia: Secondary | ICD-10-CM | POA: Diagnosis not present

## 2022-11-06 DIAGNOSIS — R262 Difficulty in walking, not elsewhere classified: Secondary | ICD-10-CM | POA: Diagnosis not present

## 2022-11-06 DIAGNOSIS — F03B18 Unspecified dementia, moderate, with other behavioral disturbance: Secondary | ICD-10-CM | POA: Diagnosis not present

## 2022-11-06 DIAGNOSIS — R410 Disorientation, unspecified: Secondary | ICD-10-CM | POA: Diagnosis not present

## 2022-11-06 DIAGNOSIS — R441 Visual hallucinations: Secondary | ICD-10-CM | POA: Diagnosis not present

## 2022-11-06 MED ORDER — MIRTAZAPINE 30 MG PO TABS
30.0000 mg | ORAL_TABLET | Freq: Every evening | ORAL | 1 refills | Status: DC
  Filled 2022-11-06: qty 90, 90d supply, fill #0

## 2022-11-07 ENCOUNTER — Other Ambulatory Visit: Payer: Self-pay

## 2022-11-08 ENCOUNTER — Other Ambulatory Visit (HOSPITAL_COMMUNITY): Payer: Self-pay

## 2022-11-08 MED ORDER — OMEPRAZOLE 20 MG PO CPDR
20.0000 mg | DELAYED_RELEASE_CAPSULE | Freq: Two times a day (BID) | ORAL | 3 refills | Status: DC
  Filled 2022-11-08: qty 90, 45d supply, fill #0
  Filled 2022-12-12: qty 90, 45d supply, fill #1

## 2022-11-10 ENCOUNTER — Other Ambulatory Visit (HOSPITAL_COMMUNITY): Payer: Self-pay

## 2022-11-23 ENCOUNTER — Other Ambulatory Visit (HOSPITAL_COMMUNITY): Payer: Self-pay

## 2022-11-24 ENCOUNTER — Other Ambulatory Visit (HOSPITAL_COMMUNITY): Payer: Self-pay

## 2022-11-24 ENCOUNTER — Other Ambulatory Visit: Payer: Self-pay

## 2022-12-08 ENCOUNTER — Other Ambulatory Visit: Payer: Self-pay

## 2022-12-12 ENCOUNTER — Other Ambulatory Visit (HOSPITAL_COMMUNITY): Payer: Self-pay

## 2022-12-24 ENCOUNTER — Emergency Department: Payer: HMO

## 2022-12-24 ENCOUNTER — Encounter: Payer: Self-pay | Admitting: Emergency Medicine

## 2022-12-24 ENCOUNTER — Other Ambulatory Visit: Payer: Self-pay

## 2022-12-24 ENCOUNTER — Inpatient Hospital Stay
Admission: EM | Admit: 2022-12-24 | Discharge: 2023-01-06 | DRG: 871 | Disposition: A | Discharge status: Hospice | Payer: HMO | Attending: Internal Medicine | Admitting: Internal Medicine

## 2022-12-24 DIAGNOSIS — Z7401 Bed confinement status: Secondary | ICD-10-CM | POA: Diagnosis not present

## 2022-12-24 DIAGNOSIS — T68XXXA Hypothermia, initial encounter: Secondary | ICD-10-CM

## 2022-12-24 DIAGNOSIS — A411 Sepsis due to other specified staphylococcus: Secondary | ICD-10-CM | POA: Diagnosis not present

## 2022-12-24 DIAGNOSIS — R1310 Dysphagia, unspecified: Secondary | ICD-10-CM | POA: Diagnosis present

## 2022-12-24 DIAGNOSIS — R0682 Tachypnea, not elsewhere classified: Secondary | ICD-10-CM | POA: Diagnosis not present

## 2022-12-24 DIAGNOSIS — Z634 Disappearance and death of family member: Secondary | ICD-10-CM

## 2022-12-24 DIAGNOSIS — N410 Acute prostatitis: Secondary | ICD-10-CM | POA: Diagnosis not present

## 2022-12-24 DIAGNOSIS — F03911 Unspecified dementia, unspecified severity, with agitation: Secondary | ICD-10-CM | POA: Diagnosis present

## 2022-12-24 DIAGNOSIS — I129 Hypertensive chronic kidney disease with stage 1 through stage 4 chronic kidney disease, or unspecified chronic kidney disease: Secondary | ICD-10-CM | POA: Diagnosis not present

## 2022-12-24 DIAGNOSIS — N179 Acute kidney failure, unspecified: Secondary | ICD-10-CM | POA: Diagnosis not present

## 2022-12-24 DIAGNOSIS — E870 Hyperosmolality and hypernatremia: Secondary | ICD-10-CM | POA: Diagnosis not present

## 2022-12-24 DIAGNOSIS — E7800 Pure hypercholesterolemia, unspecified: Secondary | ICD-10-CM | POA: Diagnosis present

## 2022-12-24 DIAGNOSIS — Z515 Encounter for palliative care: Secondary | ICD-10-CM

## 2022-12-24 DIAGNOSIS — Z808 Family history of malignant neoplasm of other organs or systems: Secondary | ICD-10-CM

## 2022-12-24 DIAGNOSIS — R000 Tachycardia, unspecified: Secondary | ICD-10-CM | POA: Diagnosis not present

## 2022-12-24 DIAGNOSIS — E1122 Type 2 diabetes mellitus with diabetic chronic kidney disease: Secondary | ICD-10-CM | POA: Diagnosis present

## 2022-12-24 DIAGNOSIS — Z87891 Personal history of nicotine dependence: Secondary | ICD-10-CM

## 2022-12-24 DIAGNOSIS — D72829 Elevated white blood cell count, unspecified: Secondary | ICD-10-CM | POA: Diagnosis not present

## 2022-12-24 DIAGNOSIS — Z66 Do not resuscitate: Secondary | ICD-10-CM | POA: Diagnosis not present

## 2022-12-24 DIAGNOSIS — M170 Bilateral primary osteoarthritis of knee: Secondary | ICD-10-CM | POA: Diagnosis present

## 2022-12-24 DIAGNOSIS — R351 Nocturia: Secondary | ICD-10-CM | POA: Diagnosis present

## 2022-12-24 DIAGNOSIS — G9341 Metabolic encephalopathy: Secondary | ICD-10-CM | POA: Diagnosis present

## 2022-12-24 DIAGNOSIS — Z1152 Encounter for screening for COVID-19: Secondary | ICD-10-CM

## 2022-12-24 DIAGNOSIS — R680 Hypothermia, not associated with low environmental temperature: Secondary | ICD-10-CM | POA: Diagnosis present

## 2022-12-24 DIAGNOSIS — Z8042 Family history of malignant neoplasm of prostate: Secondary | ICD-10-CM

## 2022-12-24 DIAGNOSIS — Z833 Family history of diabetes mellitus: Secondary | ICD-10-CM

## 2022-12-24 DIAGNOSIS — N390 Urinary tract infection, site not specified: Secondary | ICD-10-CM | POA: Diagnosis present

## 2022-12-24 DIAGNOSIS — E876 Hypokalemia: Secondary | ICD-10-CM | POA: Diagnosis not present

## 2022-12-24 DIAGNOSIS — F03918 Unspecified dementia, unspecified severity, with other behavioral disturbance: Secondary | ICD-10-CM | POA: Diagnosis not present

## 2022-12-24 DIAGNOSIS — B372 Candidiasis of skin and nail: Secondary | ICD-10-CM | POA: Diagnosis present

## 2022-12-24 DIAGNOSIS — Z79899 Other long term (current) drug therapy: Secondary | ICD-10-CM

## 2022-12-24 DIAGNOSIS — F05 Delirium due to known physiological condition: Secondary | ICD-10-CM | POA: Diagnosis present

## 2022-12-24 DIAGNOSIS — J929 Pleural plaque without asbestos: Secondary | ICD-10-CM | POA: Diagnosis not present

## 2022-12-24 DIAGNOSIS — K219 Gastro-esophageal reflux disease without esophagitis: Secondary | ICD-10-CM | POA: Diagnosis present

## 2022-12-24 DIAGNOSIS — R6520 Severe sepsis without septic shock: Secondary | ICD-10-CM | POA: Diagnosis present

## 2022-12-24 DIAGNOSIS — R339 Retention of urine, unspecified: Secondary | ICD-10-CM | POA: Diagnosis present

## 2022-12-24 DIAGNOSIS — R404 Transient alteration of awareness: Secondary | ICD-10-CM | POA: Diagnosis not present

## 2022-12-24 DIAGNOSIS — E860 Dehydration: Secondary | ICD-10-CM | POA: Diagnosis not present

## 2022-12-24 DIAGNOSIS — M1712 Unilateral primary osteoarthritis, left knee: Secondary | ICD-10-CM | POA: Diagnosis present

## 2022-12-24 DIAGNOSIS — N183 Chronic kidney disease, stage 3 unspecified: Secondary | ICD-10-CM | POA: Diagnosis present

## 2022-12-24 DIAGNOSIS — I672 Cerebral atherosclerosis: Secondary | ICD-10-CM | POA: Diagnosis not present

## 2022-12-24 DIAGNOSIS — R410 Disorientation, unspecified: Principal | ICD-10-CM

## 2022-12-24 DIAGNOSIS — R0902 Hypoxemia: Secondary | ICD-10-CM | POA: Diagnosis not present

## 2022-12-24 DIAGNOSIS — R531 Weakness: Secondary | ICD-10-CM | POA: Diagnosis not present

## 2022-12-24 DIAGNOSIS — B3789 Other sites of candidiasis: Secondary | ICD-10-CM | POA: Insufficient documentation

## 2022-12-24 DIAGNOSIS — R319 Hematuria, unspecified: Secondary | ICD-10-CM | POA: Diagnosis present

## 2022-12-24 DIAGNOSIS — N400 Enlarged prostate without lower urinary tract symptoms: Secondary | ICD-10-CM | POA: Diagnosis present

## 2022-12-24 DIAGNOSIS — R7989 Other specified abnormal findings of blood chemistry: Secondary | ICD-10-CM | POA: Insufficient documentation

## 2022-12-24 DIAGNOSIS — Z800 Family history of malignant neoplasm of digestive organs: Secondary | ICD-10-CM

## 2022-12-24 DIAGNOSIS — J690 Pneumonitis due to inhalation of food and vomit: Secondary | ICD-10-CM | POA: Diagnosis present

## 2022-12-24 DIAGNOSIS — A419 Sepsis, unspecified organism: Secondary | ICD-10-CM | POA: Diagnosis present

## 2022-12-24 DIAGNOSIS — R4182 Altered mental status, unspecified: Secondary | ICD-10-CM | POA: Insufficient documentation

## 2022-12-24 DIAGNOSIS — M1711 Unilateral primary osteoarthritis, right knee: Secondary | ICD-10-CM | POA: Diagnosis present

## 2022-12-24 DIAGNOSIS — Z880 Allergy status to penicillin: Secondary | ICD-10-CM

## 2022-12-24 DIAGNOSIS — I10 Essential (primary) hypertension: Secondary | ICD-10-CM | POA: Diagnosis present

## 2022-12-24 DIAGNOSIS — Z9183 Wandering in diseases classified elsewhere: Secondary | ICD-10-CM

## 2022-12-24 LAB — URINALYSIS, ROUTINE W REFLEX MICROSCOPIC
Bacteria, UA: NONE SEEN
Bilirubin Urine: NEGATIVE
Glucose, UA: NEGATIVE mg/dL
Ketones, ur: 5 mg/dL — AB
Leukocytes,Ua: NEGATIVE
Nitrite: NEGATIVE
Protein, ur: NEGATIVE mg/dL
Specific Gravity, Urine: 1.02 (ref 1.005–1.030)
pH: 5 (ref 5.0–8.0)

## 2022-12-24 LAB — CBC WITH DIFFERENTIAL/PLATELET
Abs Immature Granulocytes: 0.12 10*3/uL — ABNORMAL HIGH (ref 0.00–0.07)
Basophils Absolute: 0 10*3/uL (ref 0.0–0.1)
Basophils Relative: 0 %
Eosinophils Absolute: 0 10*3/uL (ref 0.0–0.5)
Eosinophils Relative: 0 %
HCT: 43.7 % (ref 39.0–52.0)
Hemoglobin: 14.5 g/dL (ref 13.0–17.0)
Immature Granulocytes: 1 %
Lymphocytes Relative: 2 %
Lymphs Abs: 0.4 10*3/uL — ABNORMAL LOW (ref 0.7–4.0)
MCH: 31.9 pg (ref 26.0–34.0)
MCHC: 33.2 g/dL (ref 30.0–36.0)
MCV: 96 fL (ref 80.0–100.0)
Monocytes Absolute: 1.9 10*3/uL — ABNORMAL HIGH (ref 0.1–1.0)
Monocytes Relative: 9 %
Neutro Abs: 18.7 10*3/uL — ABNORMAL HIGH (ref 1.7–7.7)
Neutrophils Relative %: 88 %
Platelets: 214 10*3/uL (ref 150–400)
RBC: 4.55 MIL/uL (ref 4.22–5.81)
RDW: 12.9 % (ref 11.5–15.5)
WBC: 21.1 10*3/uL — ABNORMAL HIGH (ref 4.0–10.5)
nRBC: 0 % (ref 0.0–0.2)

## 2022-12-24 LAB — BASIC METABOLIC PANEL
Anion gap: 12 (ref 5–15)
BUN: 35 mg/dL — ABNORMAL HIGH (ref 8–23)
CO2: 24 mmol/L (ref 22–32)
Calcium: 9.4 mg/dL (ref 8.9–10.3)
Chloride: 102 mmol/L (ref 98–111)
Creatinine, Ser: 1.6 mg/dL — ABNORMAL HIGH (ref 0.61–1.24)
GFR, Estimated: 40 mL/min — ABNORMAL LOW (ref >60)
Glucose, Bld: 111 mg/dL — ABNORMAL HIGH (ref 70–99)
Potassium: 4.7 mmol/L (ref 3.5–5.1)
Sodium: 138 mmol/L (ref 135–145)

## 2022-12-24 LAB — CK: Total CK: 854 U/L — ABNORMAL HIGH (ref 49–397)

## 2022-12-24 LAB — LACTIC ACID, PLASMA: Lactic Acid, Venous: 1.1 mmol/L (ref 0.5–1.9)

## 2022-12-24 LAB — PROCALCITONIN: Procalcitonin: 0.69 ng/mL

## 2022-12-24 LAB — SARS CORONAVIRUS 2 BY RT PCR: SARS Coronavirus 2 by RT PCR: NEGATIVE

## 2022-12-24 LAB — CBG MONITORING, ED: Glucose-Capillary: 70 mg/dL (ref 70–99)

## 2022-12-24 MED ORDER — SENNOSIDES-DOCUSATE SODIUM 8.6-50 MG PO TABS: 1.0000 | ORAL_TABLET | Freq: Every evening | ORAL | Status: DC | PRN

## 2022-12-24 MED ORDER — VANCOMYCIN HCL IN DEXTROSE 1-5 GM/200ML-% IV SOLN: 1000.0000 mg | Freq: Once | INTRAVENOUS | Status: DC

## 2022-12-24 MED ORDER — INSULIN ASPART 100 UNIT/ML IJ SOLN
0.0000 [IU] | Freq: Three times a day (TID) | INTRAMUSCULAR | Status: DC
  Administered 2022-12-29 (×2): 2 [IU] via SUBCUTANEOUS
  Administered 2022-12-30 – 2023-01-01 (×2): 1 [IU] via SUBCUTANEOUS
  Administered 2023-01-01: 2 [IU] via SUBCUTANEOUS
  Administered 2023-01-01 – 2023-01-04 (×4): 1 [IU] via SUBCUTANEOUS
  Filled 2022-12-24 (×9): qty 1

## 2022-12-24 MED ORDER — METRONIDAZOLE 500 MG/100ML IV SOLN
500.0000 mg | Freq: Once | INTRAVENOUS | Status: AC
  Administered 2022-12-24: 500 mg via INTRAVENOUS
  Filled 2022-12-24: qty 100

## 2022-12-24 MED ORDER — SODIUM CHLORIDE 0.9 % IV SOLN: 2.0000 g | INTRAVENOUS | Status: DC

## 2022-12-24 MED ORDER — HALOPERIDOL LACTATE 5 MG/ML IJ SOLN
1.0000 mg | Freq: Four times a day (QID) | INTRAMUSCULAR | Status: DC | PRN
  Administered 2022-12-24: 2 mg via INTRAMUSCULAR
  Administered 2022-12-25 – 2022-12-28 (×2): 1 mg via INTRAMUSCULAR
  Filled 2022-12-24 (×3): qty 1

## 2022-12-24 MED ORDER — ACETAMINOPHEN 325 MG PO TABS: 650.0000 mg | ORAL_TABLET | Freq: Four times a day (QID) | ORAL | Status: AC | PRN

## 2022-12-24 MED ORDER — VANCOMYCIN HCL 1750 MG/350ML IV SOLN
1750.0000 mg | Freq: Once | INTRAVENOUS | Status: AC
  Administered 2022-12-24: 1750 mg via INTRAVENOUS
  Filled 2022-12-24: qty 350

## 2022-12-24 MED ORDER — SODIUM CHLORIDE 0.9 % IV SOLN: 2.0000 g | Freq: Once | INTRAVENOUS | Status: DC

## 2022-12-24 MED ORDER — LACTATED RINGERS IV SOLN
150.0000 mL/h | INTRAVENOUS | Status: AC
  Administered 2022-12-24 – 2022-12-25 (×3): 150 mL/h via INTRAVENOUS

## 2022-12-24 MED ORDER — SODIUM CHLORIDE 0.9 % IV SOLN
500.0000 mg | INTRAVENOUS | Status: DC
  Administered 2022-12-25 – 2022-12-26 (×3): 500 mg via INTRAVENOUS
  Filled 2022-12-24 (×4): qty 5

## 2022-12-24 MED ORDER — SODIUM CHLORIDE 0.9 % IV SOLN
2.0000 g | Freq: Once | INTRAVENOUS | Status: AC
  Administered 2022-12-24: 2 g via INTRAVENOUS
  Filled 2022-12-24: qty 12.5

## 2022-12-24 MED ORDER — SODIUM CHLORIDE 0.9 % IV BOLUS (SEPSIS)
500.0000 mL | Freq: Once | INTRAVENOUS | Status: AC
  Administered 2022-12-24: 500 mL via INTRAVENOUS

## 2022-12-24 MED ORDER — NYSTATIN 100000 UNIT/GM EX POWD
Freq: Three times a day (TID) | CUTANEOUS | Status: AC
  Filled 2022-12-24 (×3): qty 15

## 2022-12-24 MED ORDER — ONDANSETRON HCL 4 MG PO TABS: 4.0000 mg | ORAL_TABLET | Freq: Four times a day (QID) | ORAL | Status: AC | PRN

## 2022-12-24 MED ORDER — ACETAMINOPHEN 650 MG RE SUPP: 650.0000 mg | Freq: Four times a day (QID) | RECTAL | Status: AC | PRN

## 2022-12-24 MED ORDER — METRONIDAZOLE 500 MG/100ML IV SOLN
500.0000 mg | Freq: Two times a day (BID) | INTRAVENOUS | Status: DC
  Administered 2022-12-25: 500 mg via INTRAVENOUS
  Filled 2022-12-24: qty 100

## 2022-12-24 MED ORDER — ONDANSETRON HCL 4 MG/2ML IJ SOLN: 4.0000 mg | Freq: Four times a day (QID) | INTRAMUSCULAR | Status: AC | PRN

## 2022-12-24 MED ORDER — HEPARIN SODIUM (PORCINE) 5000 UNIT/ML IJ SOLN
5000.0000 [IU] | Freq: Three times a day (TID) | INTRAMUSCULAR | Status: DC
  Administered 2022-12-24 – 2023-01-06 (×37): 5000 [IU] via SUBCUTANEOUS
  Filled 2022-12-24 (×38): qty 1

## 2022-12-24 MED ORDER — LACTATED RINGERS IV BOLUS (SEPSIS)
1000.0000 mL | Freq: Once | INTRAVENOUS | Status: AC
  Administered 2022-12-24: 1000 mL via INTRAVENOUS

## 2022-12-24 MED ORDER — INSULIN ASPART 100 UNIT/ML IJ SOLN
0.0000 [IU] | Freq: Every day | INTRAMUSCULAR | Status: DC
  Filled 2022-12-24: qty 1

## 2022-12-24 MED ORDER — SODIUM CHLORIDE 0.9 % IV BOLUS
500.0000 mL | Freq: Once | INTRAVENOUS | Status: AC
  Administered 2022-12-24: 500 mL via INTRAVENOUS

## 2022-12-24 MED ORDER — HYDRALAZINE HCL 20 MG/ML IJ SOLN: 5.0000 mg | Freq: Four times a day (QID) | INTRAMUSCULAR | Status: AC | PRN

## 2022-12-24 NOTE — Assessment & Plan Note (Signed)
Etiology workup in progress, aspiration and fall precaution

## 2022-12-24 NOTE — Consult Note (Signed)
PHARMACY -  BRIEF ANTIBIOTIC NOTE   Pharmacy has received consult(s) for aztreonam and vancomycin from an ED provider. Patient is also ordered metronidazole. The patient's profile has been reviewed for ht/wt/allergies/indication/available labs.    Patient with allergy recorded to Penicillin V Potassium (low severity, rash). No receipt of cephalosporins within our system. Will change aztreonam to cefepime per consult instructions.  One time order(s) placed for  --Vancomycin 1 g IV --Cefepime 2 g IV  Further antibiotics/pharmacy consults should be ordered by admitting physician if indicated.                       Thank you, Tressie Ellis 12/24/2022  5:47 PM

## 2022-12-24 NOTE — Assessment & Plan Note (Signed)
-   Insulin SSI with at bedtime coverage ordered °

## 2022-12-24 NOTE — H&P (Incomplete)
[place]   PATIENT NAME: Adam [name]    MR#:  [id number]  DATE OF BIRTH:  17-Oct-1929  DATE OF ADMISSION:  12/24/2022  PRIMARY CARE PHYSICIAN: Marina Goodell, MD   Patient is coming from: Home  REQUESTING/REFERRING PHYSICIAN: Minna Antis, MD  CHIEF COMPLAINT:   Chief Complaint  Patient presents with  . Altered Mental Status    HISTORY OF PRESENT ILLNESS:  Adam [name] is a 89 y.o. male with medical history significant for BPH, GERD, essential hypertension and osteoarthritis, who presented to the emergency room with acute onset of ED Course: *** EKG as reviewed by me : *** Imaging: *** PAST MEDICAL HISTORY:   Past Medical History:  Diagnosis Date  . Acute prostatitis   . BPH (benign prostatic hypertrophy)   . Dysphagia   . Elevated PSA   . Esophageal reflux   . Frequency   . HTN (hypertension)   . Nocturia   . Osteoarthritis   . Unilateral inguinal hernia   . Urgency of micturation     PAST SURGICAL HISTORY:   Past Surgical History:  Procedure Laterality Date  . CATARACT EXTRACTION Bilateral   . HERNIA REPAIR     bilateral inguinal  . LEG SURGERY     leg trauma as a child    SOCIAL HISTORY:   Social History   Tobacco Use  . Smoking status: Former    Current packs/day: 0.00    Types: Cigarettes    Quit date: 03/03/1994    Years since quitting: 28.8  . Smokeless tobacco: Never  Substance Use Topics  . Alcohol use: No    Alcohol/week: 0.0 standard drinks of alcohol    FAMILY HISTORY:   Family History  Problem Relation Age of Onset  . Colon cancer Other   . Diabetes Other   . Heart disease Other   . Skin cancer Other   . Prostate cancer Brother   . Kidney disease Neg Hx     DRUG ALLERGIES:   Allergies  Allergen Reactions  . Penicillin V Potassium Rash    REVIEW OF SYSTEMS:   ROS As per history of present illness. All pertinent systems were reviewed above. Constitutional, HEENT, cardiovascular,  respiratory, GI, GU, musculoskeletal, neuro, psychiatric, endocrine, integumentary and hematologic systems were reviewed and are otherwise negative/unremarkable except for positive findings mentioned above in the HPI.   MEDICATIONS AT HOME:   Prior to Admission medications   Medication Sig Start Date End Date Taking? Authorizing Provider  acetaminophen (TYLENOL) 650 MG CR tablet Take 650 mg by mouth every 8 (eight) hours as needed for pain.   Yes [provider]  atorvastatin (LIPITOR) 10 MG tablet Take 1 tablet (10 mg total) by mouth daily. 03/20/22  Yes   cetirizine (ZYRTEC) 10 MG tablet Take 10 mg as needed by mouth.    Yes [provider]  Cholecalciferol (D 1000) 25 MCG (1000 UT) capsule Take 1,000 Units by mouth daily.   Yes [provider]  donepezil (ARICEPT) 10 MG tablet Take 1 tablet (10 mg total) by mouth at bedtime 09/17/22  Yes   memantine (NAMENDA) 10 MG tablet Take 1 tablet (10 mg total) by mouth 2 (two) times daily 09/17/22  Yes   mirtazapine (REMERON) 30 MG tablet Take 1 tablet (30 mg total) by mouth every evening. 11/06/22  Yes   Multiple Vitamin (MULTI-VITAMINS) TABS Take 1 capsule daily by mouth.    Yes [provider]  omeprazole (  PRILOSEC) 20 MG capsule Take 1 capsule (20 mg total) by mouth 2 (two) times daily 11/07/22  Yes   risperiDONE (RISPERDAL) 0.25 MG tablet Take 1 tablet (0.25 mg total) by mouth at bedtime. 05/06/22  Yes   COVID-19 mRNA bivalent vaccine, Pfizer, (PFIZER COVID-19 VAC BIVALENT) injection Inject into the muscle. 05/16/21   Judyann Munson, MD  enalapril (VASOTEC) 2.5 MG tablet Take 1 tablet (2.5 mg total) by mouth daily. Patient not taking: Reported on 12/24/2022 05/14/22     hydrochlorothiazide (HYDRODIURIL) 25 MG tablet Take 25 mg daily by mouth. Patient not taking: Reported on 12/24/2022    [provider]  mirtazapine (REMERON) 15 MG tablet Take 1 tablet (15 mg total) by mouth at bedtime Patient not taking:  Reported on 12/24/2022 09/17/22     nystatin cream (MYCOSTATIN) Apply 1 application topically 2 (two) times daily. Patient not taking: Reported on 12/24/2022 04/25/19   Michiel Cowboy A, PA-C  nystatin-triamcinolone ointment (MYCOLOG) Apply 1 application topically 2 (two) times daily. Patient not taking: Reported on 12/24/2022 04/12/18   Michiel Cowboy A, PA-C  Omega-3 Fatty Acids (FISH OIL) 1000 MG CAPS Take 1 capsule daily by mouth.     [provider]  risperiDONE (RISPERDAL) 0.25 MG tablet Take 1 tablet (0.25 mg total) by mouth daily as directed. Patient not taking: Reported on 12/24/2022 12/17/21     risperiDONE (RISPERDAL) 0.25 MG tablet Take 1 tablet (0.25 mg total) by mouth at bedtime. Patient not taking: Reported on 12/24/2022 09/17/22     vitamin C (ASCORBIC ACID) 500 MG tablet Take by mouth.    [provider]      VITAL SIGNS:  Blood pressure (!) 142/67, pulse 86, temperature 97.7 F (36.5 C), temperature source Rectal, resp. rate 14, height 5\' 7"  (1.702 m), weight 68 kg, SpO2 97%.  PHYSICAL EXAMINATION:  Physical Exam  GENERAL:  89 y.o.-year-old patient lying in the bed with no acute distress.  EYES: Pupils equal, round, reactive to light and accommodation. No scleral icterus. Extraocular muscles intact.  HEENT: Head atraumatic, normocephalic. Oropharynx and nasopharynx clear.  NECK:  Supple, no jugular venous distention. No thyroid enlargement, no tenderness.  LUNGS: Normal breath sounds bilaterally, no wheezing, rales,rhonchi or crepitation. No use of accessory muscles of respiration.  CARDIOVASCULAR: Regular rate and rhythm, S1, S2 normal. No murmurs, rubs, or gallops.  ABDOMEN: Soft, nondistended, nontender. Bowel sounds present. No organomegaly or mass.  EXTREMITIES: No pedal edema, cyanosis, or clubbing.  NEUROLOGIC: Cranial nerves II through XII are intact. Muscle strength 5/5 in all extremities. Sensation intact. Gait not checked.  PSYCHIATRIC: The patient is  alert and oriented x 3.  Normal affect and good eye contact. SKIN: No obvious rash, lesion, or ulcer.   LABORATORY PANEL:   CBC Recent Labs  Lab 12/24/22 0857  WBC 21.1*  HGB 14.5  HCT 43.7  PLT 214   ------------------------------------------------------------------------------------------------------------------  Chemistries  Recent Labs  Lab 12/24/22 0857  NA 138  K 4.7  CL 102  CO2 24  GLUCOSE 111*  BUN 35*  CREATININE 1.60*  CALCIUM 9.4   ------------------------------------------------------------------------------------------------------------------  Cardiac Enzymes No results for input(s): "TROPONINI" in the last 168 hours. ------------------------------------------------------------------------------------------------------------------  RADIOLOGY:  DG Chest Portable 1 View  Result Date: 12/24/2022 CLINICAL DATA:  Altered mental status.  Weakness. EXAM: PORTABLE CHEST 1 VIEW COMPARISON:  AP chest 10/16/2017, CT chest 10/16/2017 FINDINGS: Cardiac silhouette and mediastinal contours are within normal limits. Mild calcification within the aortic arch. There is again flattening  of the diaphragms. Calcified pleural plaques are again seen, seen predominantly on prior CT within the anterior superior right-greater-than-left and also the posterosuperior left-greater-than-right regions. Additional right hemidiaphragm basilar calcified pleural plaque. Mild bilateral chronic interstitial thickening. No definite new acute airspace opacity is visualized. No definite pleural effusion. No pneumothorax. Moderate bilateral glenohumeral osteoarthritis. IMPRESSION: 1. No acute cardiopulmonary disease. 2. Chronic calcified pleural plaques. Electronically Signed   By: Neita Garnet M.D.   On: 12/24/2022 13:05      IMPRESSION AND PLAN:  Assessment and Plan: * Sepsis (HCC) Patient had WBC count elevation of 21.1, altered mentation, increased respiration rate, hypothermia There is no  septic shock at the time of admission Blood cultures x 2 are in process Check procalcitonin Check CK, add lab to prior collection Admit to PCU, inpatient  Candida rash of groin With redness Patient denies pain with physical examination  Nystatin powder TID, 5 days ordered  Elevated serum creatinine Does not meet criteria for acute kidney injury Patient's last serum creatinine is 1.41/eGFR 64 Status post LR 1 L bolus and sodium chloride 500 mL bolus per EDP Ordered sodium chloride 500 mL bolus on admission, and LR at 150 mL/h, 20 hours ordered  AMS (altered mental status) Etiology workup in progress, aspiration and fall precaution  Type 2 diabetes mellitus with stage 3 chronic kidney disease, without long-term current use of insulin (HCC) Insulin SSI with at bedtime coverage ordered  Essential hypertension Hydralazine 5 mg IV every 6 hours as needed for SBP greater 175, 40s ordered       DVT prophylaxis: Lovenox***  Advanced Care Planning:  Code Status: full code***  Family Communication:  The plan of care was discussed in details with the patient (and family). I answered all questions. The patient agreed to proceed with the above mentioned plan. Further management will depend upon hospital course. Disposition Plan: Back to previous home environment Consults called: none***  All the records are reviewed and case discussed with ED provider.  Status is: Inpatient {Inpatient:23812}   At the time of the admission, it appears that the appropriate admission status for this patient is inpatient.  This is judged to be reasonable and necessary in order to provide the required intensity of service to ensure the patient's safety given the presenting symptoms, physical exam findings and initial radiographic and laboratory data in the context of comorbid conditions.  The patient requires inpatient status due to high intensity of service, high risk of further deterioration and high  frequency of surveillance required.  I certify that at the time of admission, it is my clinical judgment that the patient will require inpatient hospital care extending more than 2 midnights.                            Dispo: The patient is from: Home              Anticipated d/c is to: Home              Patient currently is not medically stable to d/c.              Difficult to place patient: No  Hannah Beat M.D on 12/24/2022 at 10:59 PM  [facility]   From 7 PM-7 AM, contact night-coverage [email or web address]  CC: Primary care physician; Marina Goodell, MD

## 2022-12-24 NOTE — ED Notes (Signed)
Bair hugger removed as temp is WNL.

## 2022-12-24 NOTE — Assessment & Plan Note (Addendum)
Patient had WBC count elevation of 21.1, altered mentation, increased respiration rate, hypothermia There is no septic shock at the time of admission Blood cultures x 2 are in process Check procalcitonin Check CK, add lab to prior collection Admit to PCU, inpatient

## 2022-12-24 NOTE — Progress Notes (Signed)
12/24/22: [facility] ED RN Hospital Liaison for [facility] evaluated/screened patient chart for potential or prior engagements pending disposition. Acuity Level 3.   PCP: Maudie Flakes at [facility] [phone]. Insurance: [employer] 04/21/22.  [employer] Medicare listed unverified/connectivity issue.  No SDOH noted at this time.   Gabriel Cirri RN MSN  ED RN Liaison [place]  [facility], Population Health Email: [email or web address] Direct Dial: Teams or secure chat      [facility] Population Health Toll Free: [phone] [email or web address]                     Website: [email or web address]     Note: [facility] ED liaison does not counter or interfere with Inpatient Transitions of Care discharge planning or disposition.

## 2022-12-24 NOTE — ED Notes (Signed)
Bare hugger placed on patient per Derrill Kay, MD at this time.

## 2022-12-24 NOTE — Assessment & Plan Note (Signed)
Hydralazine 5 mg IV every 6 hours as needed for SBP greater 175, 40s ordered

## 2022-12-24 NOTE — ED Notes (Signed)
Pt placed on mittens because he pulled out his IV line and taking himself off the monitor.

## 2022-12-24 NOTE — Consult Note (Signed)
CODE SEPSIS - PHARMACY COMMUNICATION  **Broad Spectrum Antibiotics should be administered within 1 hour of Sepsis diagnosis**  Time Code Sepsis Called/Page Received: 1743  Antibiotics Ordered: Cefepime + vancomycin + metronidazole  Time of 1st antibiotic administration: 1805  Additional action taken by pharmacy: N/A  Tressie Ellis 12/24/2022  6:14 PM

## 2022-12-24 NOTE — H&P (Addendum)
Addendum: reviewed procalcitonin level which has updated as elevated at 0.67 Given patients presentation of altered mentation with markedly elevated leukocytosis, I have added Azithromycin 500 mg IV daily, 5 days ordered. - Incentive spirometry q2h while awake, 30 days ordered; Flutter valve q2h while awake, 30 days ordered - Procalcitonin recheck the AM   History and Physical   Adam [name] WUJ:[id number] DOB: 30-Sep-1929 DOA: 12/24/2022  PCP: Marina Goodell, MD  Patient [place]: [place] via [place]  I have personally briefly reviewed patient's old medical records in [facility] EMR.  Chief Concern: Mental status  HPI: Adam [name] is a 89 year old male with history of dementia, hyperlipidemia, hypertension, GERD, who presents to the emergency department for chief concerns of altered mental status.  Per ED documentation, patient was found by the woods wandering by his neighbor.  Vitals in the ED showed temperature of 95.4, improved to 97.7 with bear hugger, respiration rate 21, heart rate of 86, blood pressure 150/93, SpO2 97% on room air.  Serum sodium is 138, potassium 4.7, chloride 102, bicarb 24, BUN of 35, serum creatinine 1.60, EGFR 40, nonfasting blood glucose 111, WBC 21.1, hemoglobin 14.5, platelets of 214.  UA was negative for leukocytes and nitrates, and microscopy was read as no bacteria.  ED treatment: Cefepime 2 g IV, Flagyl 500 mg IV, vancomycin 1 g IV, lactated ringer 1 L bolus. ----------------------------- At bedside, patient is able to tell me his name, age, current location of hospital. He was not able to tell me the current month or the current year. I asked him: What is 2 + 2? Patient: 4. When I commented that to patient that he was able to add 2 + 2 but was not able to tell me the current year or month. Patient stated 'sometimes you catch me with it'.   He was not able to tell me why he was the hospital. He states he thought he had an  appointment.   He states he lives at home by himself. He states he thinks he has 5 children. He reports that he has been going through a lot of distress lately as his wife passed away last 20-Jan-2023. He then became tearful at bedside.   Social history: He lives at home by himself. He denies tobacco, etoh, and recreational drug use. He is retired and formerly worked as a Production designer, theatre/television/film in [employer].   ROS: unable to complete as patient appears to have dementia complicated by altered mental status.   ED Course: Discussed with emergency medicine provider, patient requiring hospitalization for chief concerns of altered mental status, possible sepsis.  Assessment/Plan  Principal Problem:   Sepsis (HCC) Active Problems:   Essential hypertension   Gastroesophageal reflux disease without esophagitis   Primary osteoarthritis of left knee   Primary osteoarthritis of right knee   Pure hypercholesterolemia   Type 2 diabetes mellitus with stage 3 chronic kidney disease, without long-term current use of insulin (HCC)   AMS (altered mental status)   Elevated serum creatinine   Candida rash of groin   Assessment and Plan:  * Sepsis (HCC) Patient had WBC count elevation of 21.1, altered mentation, increased respiration rate, hypothermia There is no septic shock at the time of admission Blood cultures x 2 are in process Check procalcitonin Check CK, add lab to prior collection Admit to PCU, inpatient  Candida rash of groin Scrotal skin and penis were evaluated with male EDP, Dr. Lenard Lance, as chaperone Scrotal skin  exhibits redness. Patient did not exhibit pain reaction. Negative for visual evidence of bleeding or edema Nystatin powder TID, 5 days ordered  Elevated serum creatinine Does not meet criteria for acute kidney injury Patient's last serum creatinine is 1.41/eGFR 64 Status post LR 1 L bolus and sodium chloride 500 mL bolus per EDP Ordered sodium chloride 500 mL bolus on  admission, and LR at 150 mL/h, 20 hours ordered  AMS (altered mental status) Etiology workup in progress, aspiration and fall precaution  Type 2 diabetes mellitus with stage 3 chronic kidney disease, without long-term current use of insulin (HCC) Insulin SSI with at bedtime coverage ordered  Essential hypertension Hydralazine 5 mg IV every 6 hours as needed for SBP greater 175, 40s ordered  Chart reviewed.   DVT prophylaxis: heparin 5000 units q8h Code Status: full code Diet: heart healthy Family Communication: attempted to call daughter who is the only contact listed in the phone. No pick up and voicemail did not indicated a name, therefore no identifying information or update was left Disposition Plan: pending clinical course Consults called: TOC per EDP Admission status: PCU, Inpatient  Past Medical History:  Diagnosis Date   Acute prostatitis    BPH (benign prostatic hypertrophy)    Dysphagia    Elevated PSA    Esophageal reflux    Frequency    HTN (hypertension)    Nocturia    Osteoarthritis    Unilateral inguinal hernia    Urgency of micturation    Past Surgical History:  Procedure Laterality Date   CATARACT EXTRACTION Bilateral    HERNIA REPAIR     bilateral inguinal   LEG SURGERY     leg trauma as a child   Social History:  reports that he quit smoking about 28 years ago. He has never used smokeless tobacco. He reports that he does not drink alcohol and does not use drugs.  Allergies  Allergen Reactions   Penicillin V Potassium Rash   Family History  Problem Relation Age of Onset   Colon cancer Other    Diabetes Other    Heart disease Other    Skin cancer Other    Prostate cancer Brother    Kidney disease Neg Hx    Family history: Family history reviewed and not pertinent.  Prior to Admission medications   Medication Sig Start Date End Date Taking? Authorizing Provider  atorvastatin (LIPITOR) 10 MG tablet Take 1 tablet (10 mg total) by mouth  daily. 03/20/22     cetirizine (ZYRTEC) 10 MG tablet Take 10 mg as needed by mouth.     [provider]  COVID-19 mRNA bivalent vaccine, Pfizer, (PFIZER COVID-19 VAC BIVALENT) injection Inject into the muscle. 05/16/21   Judyann Munson, MD  donepezil (ARICEPT) 10 MG tablet Take 1 tablet (10 mg total) by mouth at bedtime 09/17/22     enalapril (VASOTEC) 2.5 MG tablet Take 1 tablet (2.5 mg total) by mouth daily. 05/14/22     enalapril (VASOTEC) 20 MG tablet Take 20 mg daily by mouth.  07/19/14   [provider]  hydrochlorothiazide (HYDRODIURIL) 25 MG tablet Take 25 mg daily by mouth.    [provider]  memantine (NAMENDA) 10 MG tablet Take 1 tablet (10 mg total) by mouth 2 (two) times daily 09/17/22     mirtazapine (REMERON) 15 MG tablet Take 1 tablet (15 mg total) by mouth at bedtime 09/17/22     mirtazapine (REMERON) 30 MG tablet Take 1 tablet (30 mg total)  by mouth every evening. 11/06/22     Multiple Vitamin (MULTI-VITAMINS) TABS Take 1 capsule daily by mouth.     [provider]  nystatin cream (MYCOSTATIN) Apply 1 application topically 2 (two) times daily. 04/25/19   Michiel Cowboy A, PA-C  nystatin-triamcinolone ointment (MYCOLOG) Apply 1 application topically 2 (two) times daily. 04/12/18   Michiel Cowboy A, PA-C  Omega-3 Fatty Acids (FISH OIL) 1000 MG CAPS Take 1 capsule daily by mouth.     [provider]  omeprazole (PRILOSEC) 20 MG capsule Take 20 mg daily by mouth.  09/27/14   [provider]  omeprazole (PRILOSEC) 20 MG capsule Take 1 capsule (20 mg total) by mouth 2 (two) times daily 11/07/22     risperiDONE (RISPERDAL) 0.25 MG tablet Take 1 tablet (0.25 mg total) by mouth daily as directed. 12/17/21     risperiDONE (RISPERDAL) 0.25 MG tablet Take 1 tablet (0.25 mg total) by mouth at bedtime. 05/06/22     risperiDONE (RISPERDAL) 0.25 MG tablet Take 1 tablet (0.25 mg total) by mouth at bedtime. 09/17/22     triamcinolone lotion (KENALOG)  0.1 % Apply 1 application 2 (two) times daily as needed topically.     [provider]  vitamin C (ASCORBIC ACID) 500 MG tablet Take by mouth.    [provider]   Physical Exam: Vitals:   12/24/22 1601 12/24/22 1800 12/24/22 2000 12/24/22 2030  BP:  (!) 150/93 (!) 151/85 (!) 142/67  Pulse:  89 85 86  Resp:  (!) 26 14   Temp: 97.7 F (36.5 C)     TempSrc: Rectal     SpO2:  98% 95% 97%  Weight:      Height:       Constitutional: appears age appropriate, frail, NAD, calm Eyes: PERRL, lids and conjunctivae normal ENMT: Mucous membranes are moist. Posterior pharynx clear of any exudate or lesions. Age-appropriate dentition. Hearing appropriate Neck: normal, supple, no masses, no thyromegaly Respiratory: clear to auscultation bilaterally, no wheezing, no crackles. Normal respiratory effort. No accessory muscle use.  Cardiovascular: Regular rate and rhythm, no murmurs / rubs / gallops. No extremity edema. 2+ pedal pulses. No carotid bruits.  Abdomen: no tenderness, no masses palpated, no hepatosplenomegaly. Bowel sounds positive.  GU: scrotal skin has generalized redness. Negative for edema and negative for increased warmth.  Musculoskeletal: no clubbing / cyanosis. No joint deformity upper and lower extremities. Good ROM, no contractures, no atrophy. Normal muscle tone.  Skin: no rashes, lesions, ulcers. No induration Neurologic: Sensation intact. Strength 5/5 in all 4.  Psychiatric: Normal judgment and insight. Alert and oriented x 3. Normal mood.   EKG: ordered  Chest x-ray on Admission: I personally reviewed and I agree with radiologist reading as below.  DG Chest Portable 1 View  Result Date: 12/24/2022 CLINICAL DATA:  Altered mental status.  Weakness. EXAM: PORTABLE CHEST 1 VIEW COMPARISON:  AP chest 10/16/2017, CT chest 10/16/2017 FINDINGS: Cardiac silhouette and mediastinal contours are within normal limits. Mild calcification within the aortic arch. There is  again flattening of the diaphragms. Calcified pleural plaques are again seen, seen predominantly on prior CT within the anterior superior right-greater-than-left and also the posterosuperior left-greater-than-right regions. Additional right hemidiaphragm basilar calcified pleural plaque. Mild bilateral chronic interstitial thickening. No definite new acute airspace opacity is visualized. No definite pleural effusion. No pneumothorax. Moderate bilateral glenohumeral osteoarthritis. IMPRESSION: 1. No acute cardiopulmonary disease. 2. Chronic calcified pleural plaques. Electronically Signed   By: Kerin Salen.D.  On: 12/24/2022 13:05    Labs on Admission: I have personally reviewed following labs  CBC: Recent Labs  Lab 12/24/22 0857  WBC 21.1*  NEUTROABS 18.7*  HGB 14.5  HCT 43.7  MCV 96.0  PLT 214   Basic Metabolic Panel: Recent Labs  Lab 12/24/22 0857  NA 138  K 4.7  CL 102  CO2 24  GLUCOSE 111*  BUN 35*  CREATININE 1.60*  CALCIUM 9.4   GFR: Estimated Creatinine Clearance: 28.1 mL/min (A) (by C-G formula based on SCr of 1.6 mg/dL (H)).  Urine analysis:    Component Value Date/Time   COLORURINE YELLOW (A) 12/24/2022 1052   APPEARANCEUR HAZY (A) 12/24/2022 1052   LABSPEC 1.020 12/24/2022 1052   PHURINE 5.0 12/24/2022 1052   GLUCOSEU NEGATIVE 12/24/2022 1052   HGBUR SMALL (A) 12/24/2022 1052   BILIRUBINUR NEGATIVE 12/24/2022 1052   KETONESUR 5 (A) 12/24/2022 1052   PROTEINUR NEGATIVE 12/24/2022 1052   NITRITE NEGATIVE 12/24/2022 1052   LEUKOCYTESUR NEGATIVE 12/24/2022 1052   This document was prepared using Dragon Voice Recognition software and may include unintentional dictation errors.  Dr. Sedalia Muta [facility]  If 7PM-7AM, please contact overnight-coverage provider If 7AM-7PM, please contact day attending provider [email or web address]  12/24/2022, 11:00 PM

## 2022-12-24 NOTE — ED Notes (Signed)
Daughter at bedside. Updated on plan of care. Daughter informed this RN that she would like pt to go to nursing facility upon discharge. States that he has caregivers at home but they are not 24 hours. Brother that is POA en route to hospital. Derrill Kay, MD made aware.

## 2022-12-24 NOTE — Consult Note (Signed)
Pharmacy Antibiotic Note  Adam [name] is a 89 y.o. male admitted on 12/24/2022 with sepsis.  Pharmacy has been consulted for cefepime and vancomycin dosing. Pt is afeb, with tachycardia, WBC 21.1, Scr is possibly elevated above baseline. No source identified.   Plan: Start Cefepime 2 g q24H   Will give vancomycin 1750 mg x 1 loading dose. Follow up with Scr on 9/5. If back to baseline (1.1 per 2023 labs), dose via AUC dosing. If it continue to trend up, dose by levels or switch to a different agent.   Continue flagyl 500 mg BID  Height: 5\' 7"  (170.2 cm) Weight: 68 kg (149 lb 14.6 oz) IBW/kg (Calculated) : 66.1  Temp (24hrs), Avg:97.3 F (36.3 C), Min:95.4 F (35.2 C), Max:98.4 F (36.9 C)  Recent Labs  Lab 12/24/22 0857 12/24/22 1138  WBC 21.1*  --   CREATININE 1.60*  --   LATICACIDVEN  --  1.1    Estimated Creatinine Clearance: 28.1 mL/min (A) (by C-G formula based on SCr of 1.6 mg/dL (H)).    Allergies  Allergen Reactions   Penicillin V Potassium Rash    Antimicrobials this admission: 9/4 cefepime >>  9/4 flagyl >>  9/4 vancomycin >>   Dose adjustments this admission: None  Microbiology results: 9/4 BCx: pending   Thank you for allowing pharmacy to be a part of this patient's care.  Ronnald Ramp, PharmD, BCPS 12/24/2022 6:37 PM

## 2022-12-24 NOTE — TOC Progression Note (Signed)
Transition of Care [facility]) - Progression Note    Patient Details  Name: SHADEN HAUGHT MRN: [id number] Date of Birth: 14-May-1929  Transition of Care [facility]) CM/SW Contact  Darolyn Rua, [place] Phone Number: 12/24/2022, 5:52 PM  Clinical Narrative:     CSW received message from RN that patient family eager to see this CSW.   Met with family at bedside they report concerns if MD to discharge patient this evening patient is not safe to go home, CSW informed them there is no discharge order and has messaged MD to update that placement is actively being worked on.   CSW informed them of potential bed offer from [facility] pending the costs of patient's stay as their rep Domingo Dimes will call this CSW in the morning for private pay costs until patient spends down to use medicaid.   They report also being interested in [place] in [place], CSW to follow up in the am.   No further questions or concerns at this time.    Expected Discharge Plan: Memory Care Barriers to Discharge: Continued Medical Work up  Expected Discharge Plan and Services       Living arrangements for the past 2 months: Single Family Home                                       Social Determinants of Health (SDOH) Interventions SDOH Screenings   Food Insecurity: No Food Insecurity (10/20/2022)   Received from [facility]  Transportation Needs: No Transportation Needs (10/20/2022)   Received from [facility]  Utilities: Not At Risk (10/20/2022)   Received from [facility]  Financial Resource Strain: Low Risk  (10/20/2022)   Received from [facility]  Tobacco Use: Medium Risk (12/24/2022)    Readmission Risk Interventions     No data to display

## 2022-12-24 NOTE — ED Provider Notes (Signed)
[facility] Provider Note    Event Date/Time   First MD Initiated Contact with Patient 12/24/22 [phone]     (approximate)   History   Wandering   HPI  Adam [name] is a 89 y.o. male   who presents to the emergency department today via EMS after being found wandering outside.  Apparently the patient was spotted by a neighbor outside of his house this morning.  The patient is unable to give any significant history but states he went outside this morning to walk and look at birds.  Per chart review patient has a history of dementia.  EMS states that he lives by himself.      Physical Exam   Triage Vital Signs: ED Triage Vitals  Encounter Vitals Group     BP 12/24/22 0900 132/73     Systolic BP Percentile --      Diastolic BP Percentile --      Pulse Rate 12/24/22 0900 86     Resp 12/24/22 0900 (!) 22     Temp 12/24/22 0902 (!) 95.4 F (35.2 C)     Temp Source 12/24/22 0902 Rectal     SpO2 12/24/22 0900 97 %     Weight 12/24/22 0900 149 lb 14.6 oz (68 kg)     Height 12/24/22 0900 5\' 7"  (1.702 m)     Head Circumference --      Peak Flow --      Pain Score 12/24/22 0859 0     Pain Loc --      Pain Education --      Exclude from Growth Chart --     Most recent vital signs: Vitals:   12/24/22 0900 12/24/22 0902  BP: 132/73   Pulse: 86   Resp: (!) 22   Temp:  (!) 95.4 F (35.2 C)  SpO2: 97%    General: Awake, alert, not completely oriented. CV:  Good peripheral perfusion. Regular rate and rhythm. Resp:  Normal effort. Lungs clear. Abd:  No distention.    ED Results / Procedures / Treatments   Labs (all labs ordered are listed, but only abnormal results are displayed) Labs Reviewed  CBC WITH DIFFERENTIAL/PLATELET - Abnormal; Notable for the following components:      Result Value   WBC 21.1 (*)    Neutro Abs 18.7 (*)    Lymphs Abs 0.4 (*)    Monocytes Absolute 1.9 (*)    Abs Immature Granulocytes 0.12 (*)    All other  components within normal limits  BASIC METABOLIC PANEL - Abnormal; Notable for the following components:   Glucose, Bld 111 (*)    BUN 35 (*)    Creatinine, Ser 1.60 (*)    GFR, Estimated 40 (*)    All other components within normal limits  URINALYSIS, ROUTINE W REFLEX MICROSCOPIC - Abnormal; Notable for the following components:   Color, Urine YELLOW (*)    APPearance HAZY (*)    Hgb urine dipstick SMALL (*)    Ketones, ur 5 (*)    All other components within normal limits  SARS CORONAVIRUS 2 BY RT PCR  CULTURE, BLOOD (ROUTINE X 2)  CULTURE, BLOOD (ROUTINE X 2)  LACTIC ACID, PLASMA     EKG  None   RADIOLOGY I independently interpreted and visualized the CXR. My interpretation: No pneumonia Radiology interpretation:  IMPRESSION:  1. No acute cardiopulmonary disease.  2. Chronic calcified pleural plaques.     PROCEDURES:  Critical  Care performed: No    MEDICATIONS ORDERED IN ED: Medications - No data to display   IMPRESSION / MDM / ASSESSMENT AND PLAN / ED COURSE  I reviewed the triage vital signs and the nursing notes.                              Differential diagnosis includes, but is not limited to, exposure, infection, dementia  Patient's presentation is most consistent with acute presentation with potential threat to life or bodily function.   The patient is on the cardiac monitor to evaluate for evidence of arrhythmia and/or significant heart rate changes.  Patient presented to the emergency department today because of concerns for being found wandering outside.  Patient has history of dementia.  Initial vital signs were concerning for slight hypothermia. Temperature did elevate after patient was placed on [employer]  Blood work shows a leukocytosis.  Somewhat unclear etiology at this time.  No lactic acidosis.  Urine without signs of infection.  Chest x-ray without pneumonia.  Did have discussion with family.  At this time there is concern for patient  being discharged home.  Will have social work evaluate.      FINAL CLINICAL IMPRESSION(S) / ED DIAGNOSES   Final diagnoses:  Confusion  Hypothermia, initial encounter  Leukocytosis, unspecified type     Note:  This document was prepared using Dragon voice recognition software and may include unintentional dictation errors.    Phineas Semen, MD 12/24/22 [phone]

## 2022-12-24 NOTE — ED Notes (Signed)
Attempted to collect urine sample. Pt unable to provide sample at this time.

## 2022-12-24 NOTE — TOC Initial Note (Signed)
Transition of Care [facility]) - Initial/Assessment Note    Patient Details  Name: Adam [name] MRN: [id number] Date of Birth: 28-Dec-1929  Transition of Care [facility]) CM/SW Contact:    Darolyn Rua, LCSW Phone Number: 12/24/2022, 11:34 AM  Clinical Narrative:                  Patient presents to ED after being found in woods, hx of Dementia.   Patient's daughter, son and son in law at bedside, they do report needing placement for patient due to safety reasons as he is no longer safe at home due to wandering behaviors.   They report they may have funds to private pay for placement for a short period of time prior to Medicaid kicking In, they are agreeable to talk to financial counselor to started [facility] application. CSW has made referral to financial counseling, agreeable for referrals to be sent to large geographical area as they are aware of limited Medicaid long term beds.   Referrals sent pending bed offers at this time.    Expected Discharge Plan: Memory Care Barriers to Discharge: Continued Medical Work up   Patient Goals and CMS Choice   CMS Medicare.gov Compare Post Acute Care list provided to:: Patient Represenative (must comment) (family at bedside)        Expected Discharge Plan and Services       Living arrangements for the past 2 months: Single Family Home                                      Prior Living Arrangements/Services Living arrangements for the past 2 months: Single Family Home Lives with:: Self                   Activities of Daily Living      Permission Sought/Granted                  Emotional Assessment              Admission diagnosis:  Fall, EMS Patient Active Problem List   Diagnosis Date Noted   Essential hypertension 01/04/2016   Gastroesophageal reflux disease without esophagitis 01/04/2016   Pure hypercholesterolemia 01/04/2016   Type 2 diabetes mellitus with stage 3 chronic kidney disease, without  long-term current use of insulin (HCC) 01/04/2016   Primary osteoarthritis of left knee 07/10/2015   Primary osteoarthritis of right knee 07/10/2015   BPH with obstruction/lower urinary tract symptoms 03/04/2015   PCP:  Marina Goodell, MD Pharmacy:   [facility] [address] [place] - [address] [place] [address] Phone: [phone] Fax: [phone]     Social Determinants of Health (SDOH) Social History: SDOH Screenings   Food Insecurity: No Food Insecurity (10/20/2022)   Received from [facility]  Transportation Needs: No Transportation Needs (10/20/2022)   Received from [facility]  Utilities: Not At Risk (10/20/2022)   Received from [facility]  Financial Resource Strain: Low Risk  (10/20/2022)   Received from [facility]  Tobacco Use: Medium Risk (12/24/2022)   SDOH Interventions:     Readmission Risk Interventions     No data to display

## 2022-12-24 NOTE — Assessment & Plan Note (Signed)
Does not meet criteria for acute kidney injury Patient's last serum creatinine is 1.41/eGFR 64 Status post LR 1 L bolus and sodium chloride 500 mL bolus per EDP Ordered sodium chloride 500 mL bolus on admission, and LR at 150 mL/h, 20 hours ordered

## 2022-12-24 NOTE — ED Provider Notes (Addendum)
-----------------------------------------   6:23 PM on 12/24/2022 ----------------------------------------- I have reviewed the patient's medical workup.  Has a white blood cell count of 21,000 initially hypothermic at 95 degrees temperature has come up after being on a [employer]  Patient initially tachycardic remains tachycardic around 110 bpm as well as slightly tachypneic.  Concern for possible sepsis will start the patient on broad-spectrum antibiotics and send cultures.  Reassuringly patient's lactate is normal COVID is negative urinalysis shows no obvious infection and chemistry shows mild renal insufficiency.  We will start antibiotics and admit until the patient's urine and blood cultures have grown out.  I spoke to the patient's family he has a history of dementia which they state is mild patient lives independently but does have home health for approximately 6 hours/day.  Son states today the patient is far more confused than typical again, possibly indicative of an ongoing infectious etiology.  Will admit to the hospital service for further workup and treatment.  CRITICAL CARE Performed by: Minna Antis   Total critical care time: 30 minutes  Critical care time was exclusive of separately billable procedures and treating other patients.  Critical care was necessary to treat or prevent imminent or life-threatening deterioration.  Critical care was time spent personally by me on the following activities: development of treatment plan with patient and/or surrogate as well as nursing, discussions with consultants, evaluation of patient's response to treatment, examination of patient, obtaining history from patient or surrogate, ordering and performing treatments and interventions, ordering and review of laboratory studies, ordering and review of radiographic studies, pulse oximetry and re-evaluation of patient's condition.       Minna Antis, MD 12/24/22 [phone]

## 2022-12-24 NOTE — [place] FL2 (Signed)
[place] MEDICAID FL2 LEVEL OF CARE FORM     IDENTIFICATION  Patient Name: Adam [name] Birthdate: 09-Jan-1930 Sex: male Admission Date (Current Location): 12/24/2022  [place] and [place] Number:  Chiropodist and Address:  [facility], [address] [place], [place] [address]      Provider Number: [id number]  Attending Physician Name and Address:  Phineas Semen, MD  Relative Name and Phone Number:  Vernona Rieger (daughter )[phone]    Current Level of Care: Hospital Recommended Level of Care: Other (Comment) (memory care) Prior Approval Number:    Date Approved/Denied:   PASRR Number: [id number]  Discharge Plan: Other (Comment) (memory care)    Current Diagnoses: Patient Active Problem List   Diagnosis Date Noted   Essential hypertension 01/04/2016   Gastroesophageal reflux disease without esophagitis 01/04/2016   Pure hypercholesterolemia 01/04/2016   Type 2 diabetes mellitus with stage 3 chronic kidney disease, without long-term current use of insulin (HCC) 01/04/2016   Primary osteoarthritis of left knee 07/10/2015   Primary osteoarthritis of right knee 07/10/2015   BPH with obstruction/lower urinary tract symptoms 03/04/2015    Orientation RESPIRATION BLADDER Height & Weight     Self, Place  Normal Incontinent Weight: 149 lb 14.6 oz (68 kg) Height:  5\' 7"  (170.2 cm)  BEHAVIORAL SYMPTOMS/MOOD NEUROLOGICAL BOWEL NUTRITION STATUS      Continent Diet (see discharge summary)  AMBULATORY STATUS COMMUNICATION OF NEEDS Skin   Limited Assist Verbally Normal                       Personal Care Assistance Level of Assistance  Bathing, Feeding, Dressing, Total care Bathing Assistance: Limited assistance Feeding assistance: Independent Dressing Assistance: Limited assistance Total Care Assistance: Limited assistance   Functional Limitations Info  Sight, Hearing, Speech Sight Info: Impaired Hearing Info: Adequate Speech  Info: Adequate    SPECIAL CARE FACTORS FREQUENCY                       Contractures Contractures Info: Not present    Additional Factors Info  Code Status, Allergies Code Status Info: full Allergies Info: penicillin v potassium           Current Medications (12/24/2022):  This is the current hospital active medication list Current Facility-Administered Medications  Medication Dose Route Frequency Provider Last Rate Last Admin   sodium chloride 0.9 % bolus 500 mL  500 mL Intravenous Once Phineas Semen, MD       Current Outpatient Medications  Medication Sig Dispense Refill   atorvastatin (LIPITOR) 10 MG tablet Take 1 tablet (10 mg total) by mouth daily. 90 tablet 3   cetirizine (ZYRTEC) 10 MG tablet Take 10 mg as needed by mouth.      COVID-19 mRNA bivalent vaccine, Pfizer, (PFIZER COVID-19 VAC BIVALENT) injection Inject into the muscle. 0.3 mL 0   donepezil (ARICEPT) 10 MG tablet Take 1 tablet (10 mg total) by mouth at bedtime 90 tablet 1   enalapril (VASOTEC) 2.5 MG tablet Take 1 tablet (2.5 mg total) by mouth daily. 90 tablet 1   enalapril (VASOTEC) 20 MG tablet Take 20 mg daily by mouth.      hydrochlorothiazide (HYDRODIURIL) 25 MG tablet Take 25 mg daily by mouth.     memantine (NAMENDA) 10 MG tablet Take 1 tablet (10 mg total) by mouth 2 (two) times daily 180 tablet 1   mirtazapine (REMERON) 15 MG tablet Take 1 tablet (15  mg total) by mouth at bedtime 90 tablet 1   mirtazapine (REMERON) 30 MG tablet Take 1 tablet (30 mg total) by mouth every evening. 90 tablet 1   Multiple Vitamin (MULTI-VITAMINS) TABS Take 1 capsule daily by mouth.      nystatin cream (MYCOSTATIN) Apply 1 application topically 2 (two) times daily. 30 g 0   nystatin-triamcinolone ointment (MYCOLOG) Apply 1 application topically 2 (two) times daily. 30 g 0   Omega-3 Fatty Acids (FISH OIL) 1000 MG CAPS Take 1 capsule daily by mouth.      omeprazole (PRILOSEC) 20 MG capsule Take 20 mg daily by mouth.       omeprazole (PRILOSEC) 20 MG capsule Take 1 capsule (20 mg total) by mouth 2 (two) times daily 90 capsule 3   risperiDONE (RISPERDAL) 0.25 MG tablet Take 1 tablet (0.25 mg total) by mouth daily as directed. 90 tablet 1   risperiDONE (RISPERDAL) 0.25 MG tablet Take 1 tablet (0.25 mg total) by mouth at bedtime. 90 tablet 1   risperiDONE (RISPERDAL) 0.25 MG tablet Take 1 tablet (0.25 mg total) by mouth at bedtime. 90 tablet 1   triamcinolone lotion (KENALOG) 0.1 % Apply 1 application 2 (two) times daily as needed topically.      vitamin C (ASCORBIC ACID) 500 MG tablet Take by mouth.       Discharge Medications: Please see discharge summary for a list of discharge medications.  Relevant Imaging Results:  Relevant Lab Results:   Additional Information SSN: [id number]  Darolyn Rua, LCSW

## 2022-12-24 NOTE — Assessment & Plan Note (Addendum)
Scrotal skin and penis were evaluated with male EDP, Dr. Lenard Lance, as chaperone Scrotal skin exhibits redness. Patient did not exhibit pain reaction. Negative for visual evidence of bleeding or edema Nystatin powder TID, 5 days ordered

## 2022-12-24 NOTE — ED Triage Notes (Signed)
[place] [facility] [place]. Found by neighbor- unsure how long patient was outside. Pt denies any complaints- hx of dementia and lives at home alone. Scab noted to the left forehead and skin tear to left elbow. At baseline per family.

## 2022-12-24 NOTE — Hospital Course (Addendum)
[name] is a 89 year old male with history of dementia, hyperlipidemia, hypertension, GERD, who presents to the emergency department for chief concerns of altered mental status.  Per ED documentation, patient was found by the woods wandering by his neighbor.  Vitals in the ED showed temperature of 95.4, improved to 97.7 with bear hugger, respiration rate 21, heart rate of 86, blood pressure 150/93, SpO2 97% on room air.  Serum sodium is 138, potassium 4.7, chloride 102, bicarb 24, BUN of 35, serum creatinine 1.60, EGFR 40, nonfasting blood glucose 111, WBC 21.1, hemoglobin 14.5, platelets of 214.  UA was negative for leukocytes and nitrates, and microscopy was read as no bacteria.  ED treatment: Cefepime 2 g IV, Flagyl 500 mg IV, vancomycin 1 g IV, lactated ringer 1 L bolus.

## 2022-12-24 NOTE — ED Notes (Addendum)
During intentional rounding Pt found taking off his clothes, pulled off all Ivs and EKG leads. Pt was hooked back up to monitor. No falls noted, and bed alarm placed for Pt safety. Pt was placed in a gown and new IV will be placed. Son is concerned for Pt safety and asking for LCSW at bedside, messaged her to speak to son again.

## 2022-12-25 DIAGNOSIS — A419 Sepsis, unspecified organism: Secondary | ICD-10-CM | POA: Diagnosis not present

## 2022-12-25 LAB — BLOOD CULTURE ID PANEL (REFLEXED) - BCID2

## 2022-12-25 LAB — BLOOD GAS, ARTERIAL
Acid-base deficit: 1.7 mmol/L (ref 0.0–2.0)
Bicarbonate: 23 mmol/L (ref 20.0–28.0)
O2 Saturation: 97.7 %
Patient temperature: 37
pCO2 arterial: 38 mmHg (ref 32–48)
pH, Arterial: 7.39 (ref 7.35–7.45)
pO2, Arterial: 80 mmHg — ABNORMAL LOW (ref 83–108)

## 2022-12-25 LAB — GLUCOSE, CAPILLARY
Glucose-Capillary: 82 mg/dL (ref 70–99)
Glucose-Capillary: 78 mg/dL (ref 70–99)

## 2022-12-25 LAB — CBG MONITORING, ED
Glucose-Capillary: 101 mg/dL — ABNORMAL HIGH (ref 70–99)
Glucose-Capillary: 75 mg/dL (ref 70–99)
Glucose-Capillary: 82 mg/dL (ref 70–99)

## 2022-12-25 LAB — BASIC METABOLIC PANEL
Anion gap: 10 (ref 5–15)
BUN: 24 mg/dL — ABNORMAL HIGH (ref 8–23)
CO2: 23 mmol/L (ref 22–32)
Calcium: 8.6 mg/dL — ABNORMAL LOW (ref 8.9–10.3)
Chloride: 105 mmol/L (ref 98–111)
Creatinine, Ser: 1.08 mg/dL (ref 0.61–1.24)
GFR, Estimated: >60 mL/min (ref >60)
Glucose, Bld: 88 mg/dL (ref 70–99)
Potassium: 3.8 mmol/L (ref 3.5–5.1)
Sodium: 138 mmol/L (ref 135–145)

## 2022-12-25 LAB — CBC
HCT: 42 % (ref 39.0–52.0)
Hemoglobin: 13.9 g/dL (ref 13.0–17.0)
MCH: 31.9 pg (ref 26.0–34.0)
MCHC: 33.1 g/dL (ref 30.0–36.0)
MCV: 96.3 fL (ref 80.0–100.0)
Platelets: 164 10*3/uL (ref 150–400)
RBC: 4.36 MIL/uL (ref 4.22–5.81)
RDW: 13 % (ref 11.5–15.5)
WBC: 10.6 10*3/uL — ABNORMAL HIGH (ref 4.0–10.5)
nRBC: 0 % (ref 0.0–0.2)

## 2022-12-25 LAB — PROCALCITONIN: Procalcitonin: 2.37 ng/mL

## 2022-12-25 LAB — HEMOGLOBIN A1C
Hgb A1c MFr Bld: 6.2 % — ABNORMAL HIGH (ref 4.8–5.6)
Mean Plasma Glucose: 131.24 mg/dL

## 2022-12-25 MED ORDER — INFLUENZA VAC A&B SURF ANT ADJ 0.5 ML IM SUSY
0.5000 mL | PREFILLED_SYRINGE | INTRAMUSCULAR | Status: DC
  Filled 2022-12-25: qty 0.5

## 2022-12-25 MED ORDER — SODIUM CHLORIDE 0.9 % IV SOLN
1.0000 g | INTRAVENOUS | Status: DC
  Administered 2022-12-25 – 2022-12-30 (×5): 1 g via INTRAVENOUS
  Filled 2022-12-25 (×6): qty 10

## 2022-12-25 MED ORDER — DIPHENHYDRAMINE HCL 25 MG PO CAPS
25.0000 mg | ORAL_CAPSULE | Freq: Once | ORAL | Status: AC
  Administered 2022-12-25: 25 mg via ORAL
  Filled 2022-12-25: qty 1

## 2022-12-25 MED ORDER — PANTOPRAZOLE SODIUM 40 MG IV SOLR
40.0000 mg | INTRAVENOUS | Status: DC
  Administered 2022-12-25 – 2023-01-04 (×11): 40 mg via INTRAVENOUS
  Filled 2022-12-25 (×11): qty 10

## 2022-12-25 MED ORDER — ZIPRASIDONE MESYLATE 20 MG IM SOLR
10.0000 mg | Freq: Four times a day (QID) | INTRAMUSCULAR | Status: DC | PRN
  Administered 2022-12-25: 10 mg via INTRAMUSCULAR
  Filled 2022-12-25: qty 20

## 2022-12-25 NOTE — ED Notes (Signed)
Informed family that MD would be calling them soon with an update

## 2022-12-25 NOTE — TOC Progression Note (Addendum)
Transition of Care [facility]) - Progression Note    Patient Details  Name: Adam [name] MRN: [id number] Date of Birth: 1929-09-10  Transition [facility] CM/SW Contact  Darolyn Rua, [place] Phone Number: 12/25/2022, 9:28 AM  Clinical Narrative:     CSW spoke with Dylan with [facility] who reports their pricing is $386/day which comes to almost 12k per month.   CSW spoke with Hospital doctor at [facility] in [place], [place] at [phone]. She reports for a private room they run about $3,500 per month. Amber requests clinicals be sent for referral to [email or web address]  Facesheet, h&p, med list and fl2 has been sent to [facility].   Patient's son arcangelo allums updated on above.    CSW notes patient is now being worked up for sepsis as of 9/4 and potential to admit to unit. CSW  notes patient appears to have declined health status which may indicate potential for health insurance to cover snf, then patient family can private pay/spend down until patient can apply for Medicaid for long term.    Expected Discharge Plan: Memory Care Barriers to Discharge: Continued Medical Work up  Expected Discharge Plan and Services       Living arrangements for the past 2 months: Single Family Home                                       Social Determinants of Health (SDOH) Interventions SDOH Screenings   Food Insecurity: No Food Insecurity (10/20/2022)   Received from [facility]  Transportation Needs: No Transportation Needs (10/20/2022)   Received from [facility]  Utilities: Not At Risk (10/20/2022)   [facility] from [facility]  Financial Resource Strain: Low Risk  (10/20/2022)   Received from [facility]  Tobacco Use: Medium Risk (12/24/2022)    Readmission Risk Interventions     No data to display
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
## 2022-12-25 NOTE — ED Notes (Signed)
Pt cleaned up at this time with new pad placed under patient. Fresh blankets given. Pt repositioned in bed.

## 2022-12-25 NOTE — Progress Notes (Signed)
PROGRESS NOTE    Adam [name]  ZOX:[id number] DOB: 01/04/1930 DOA: 12/24/2022 PCP: Marina Goodell, MD   Assessment & Plan:   Principal Problem:   Sepsis [facility]) Active Problems:   Essential hypertension   Gastroesophageal reflux disease without esophagitis   Primary osteoarthritis of left knee   Primary osteoarthritis of right knee   Pure hypercholesterolemia   Type 2 diabetes mellitus with stage 3 chronic kidney disease, without long-term current use of insulin (HCC)   AMS (altered mental status)   Elevated serum creatinine   Candida rash of groin  Assessment and Plan:  Sepsis: met criteria w/ leukocytosis, tachypnea, hypothermia but unknown source, possibly UTI. Continue on IV rocephin, azithromycin. Procal is 2.37.   Unlikely bacteremia: blood cxs growing staph, likely containment. Repeat blood cxs ordered   Candida rash of groin: continue w/ nystatin powder   Acute encephalopathy: etiology unclear, infection vs delirium vs worsening dementia. Re-orient prn. Continue on IV abxs    DM2: likely well controlled. Continue on SSI w/ accuchecks  Likely AKI: baseline Cr/GFR is unknown. Cr is trending down from day prior   Urinary retention: foley placed in ER 12/25/22. Urine cx ordered secondary c/o dysuria & meeting criteria for sepsis    HLD: continue on statin  HTN: no longer taking hydrochlorothiazide,enalapril. IV hydralazine prn  Dementia: oriented to self & place only. Continue w/ supportive care      DVT prophylaxis: heparin  Code Status: full  Family Communication: discussed pt's care w/ pt's daughter, Vernona Rieger & answered her questions Disposition Plan: depends on PT/OT recs  Level of care: Progressive Status is: Inpatient Remains inpatient appropriate because: severity of illness     Consultants:    Procedures:   Antimicrobials: rocephin, azithromycin    Subjective: Pt c/o burning w/ urination & difficulty urinating   Objective: Vitals:    12/25/22 0330 12/25/22 0536 12/25/22 0630 12/25/22 0700  BP: (!) 132/57 (!) 152/93 137/71 (!) 110/96  Pulse: 92 82 83 83  Resp: 14 16  13   Temp:      TempSrc:      SpO2: 97% 95% 97% 97%  Weight:      Height:        Intake/Output Summary (Last 24 hours) at 12/25/2022 0854 Last data filed at 12/25/2022 0343 Gross per 24 hour  Intake 1193.54 ml  Output 550 ml  Net 643.54 ml   Filed Weights   12/24/22 0900  Weight: 68 kg    Examination:  General exam: Appears calm but uncomfortable  Respiratory system: Clear to auscultation. Respiratory effort normal. Cardiovascular system: S1 & S2+. No  rubs, gallops or clicks Gastrointestinal system: Abdomen is nondistended, soft and nontender.  Normal bowel sounds heard. Central nervous system: Alert and oriented to self & place only  Psychiatry: Judgement and insight appears at baseline. Flat mood and affect.     Data Reviewed: I have personally reviewed following labs and imaging studies  CBC: Recent Labs  Lab 12/24/22 0857 12/25/22 0453  WBC 21.1* 10.6*  NEUTROABS 18.7*  --   HGB 14.5 13.9  HCT 43.7 42.0  MCV 96.0 96.3  PLT 214 164   Basic Metabolic Panel: Recent Labs  Lab 12/24/22 0857 12/25/22 0453  NA 138 138  K 4.7 3.8  CL 102 105  CO2 24 23  GLUCOSE 111* 88  BUN 35* 24*  CREATININE 1.60* 1.08  CALCIUM 9.4 8.6*   GFR: Estimated Creatinine Clearance: 41.7 mL/min (by C-G formula based on  SCr of 1.08 mg/dL). Liver Function Tests: No results for input(s): "AST", "ALT", "ALKPHOS", "BILITOT", "PROT", "ALBUMIN" in the last 168 hours. No results for input(s): "LIPASE", "AMYLASE" in the last 168 hours. No results for input(s): "AMMONIA" in the last 168 hours. Coagulation Profile: No results for input(s): "INR", "PROTIME" in the last 168 hours. Cardiac Enzymes: Recent Labs  Lab 12/24/22 0857  CKTOTAL 854*   BNP (last 3 results) No results for input(s): "PROBNP" in the last 8760 hours. HbA1C: No results for  input(s): "HGBA1C" in the last 72 hours. CBG: Recent Labs  Lab 12/24/22 2303 12/25/22 0041 12/25/22 0844  GLUCAP 70 101* 82   Lipid Profile: No results for input(s): "CHOL", "HDL", "LDLCALC", "TRIG", "CHOLHDL", "LDLDIRECT" in the last 72 hours. Thyroid Function Tests: No results for input(s): "TSH", "T4TOTAL", "FREET4", "T3FREE", "THYROIDAB" in the last 72 hours. Anemia Panel: No results for input(s): "VITAMINB12", "FOLATE", "FERRITIN", "TIBC", "IRON", "RETICCTPCT" in the last 72 hours. Sepsis Labs: Recent Labs  Lab 12/24/22 0857 12/24/22 1138 12/25/22 0453  PROCALCITON 0.69  --  2.37  LATICACIDVEN  --  1.1  --     Recent Results (from the past 240 hour(s))  SARS Coronavirus 2 by RT PCR (hospital order, performed in [facility] hospital lab) *cepheid single result test* Anterior Nasal Swab     Status: None   Collection Time: 12/24/22  2:06 PM   Specimen: Anterior Nasal Swab  Result Value Ref Range Status   SARS Coronavirus 2 by RT PCR NEGATIVE NEGATIVE Final    Comment: (NOTE) SARS-CoV-2 target nucleic acids are NOT DETECTED.  The SARS-CoV-2 RNA is generally detectable in upper and lower respiratory specimens during the acute phase of infection. The lowest concentration of SARS-CoV-2 viral copies this assay can detect is 250 copies / mL. A negative result does not preclude SARS-CoV-2 infection and should not be used as the sole basis for treatment or other patient management decisions.  A negative result may occur with improper specimen collection / handling, submission of specimen other than nasopharyngeal swab, presence of viral mutation(s) within the areas targeted by this assay, and inadequate number of viral copies (<250 copies / mL). A negative result must be combined with clinical observations, patient history, and epidemiological information.  Fact Sheet for Patients:   RoadLapTop.co.za  Fact Sheet for Healthcare  Providers: http://kim-miller.com/  This test is not yet approved or  cleared by the [place] FDA and has been authorized for detection and/or diagnosis of SARS-CoV-2 by FDA under an Emergency Use Authorization (EUA).  This EUA will remain in effect (meaning this test can be used) for the duration of the COVID-19 declaration under Section 564(b)(1) of the Act, 21 U.S.C. section 360bbb-3(b)(1), unless the authorization is terminated or revoked sooner.  Performed at [facility], [address] [place], [place] [address]   Blood culture (routine x 2)     Status: None (Preliminary result)   Collection Time: 12/24/22  2:07 PM   Specimen: BLOOD  Result Value Ref Range Status   Specimen Description BLOOD BLOOD RIGHT FOREARM  Final   Special Requests   Final    BOTTLES DRAWN AEROBIC AND ANAEROBIC Blood Culture results may not be optimal due to an inadequate volume of blood received in culture bottles   Culture  Setup Time   Final    GRAM POSITIVE COCCI IN BOTH AEROBIC AND ANAEROBIC BOTTLES Organism ID to follow CRITICAL RESULT CALLED TO, READ BACK BY AND VERIFIED WITH: [email or web address]  12/25/22  RH Performed at [facility], [address] [place], [place] [address]    Culture [facility] POSITIVE COCCI  Final   Report Status PENDING  Incomplete  Blood Culture ID Panel (Reflexed)     Status: Abnormal   Collection Time: 12/24/22  2:07 PM  Result Value Ref Range Status   Enterococcus faecalis NOT DETECTED NOT DETECTED Final   Enterococcus Faecium NOT DETECTED NOT DETECTED Final   Listeria monocytogenes NOT DETECTED NOT DETECTED Final   Staphylococcus species DETECTED (A) NOT DETECTED Final    Comment: CRITICAL RESULT CALLED TO, READ BACK BY AND VERIFIED WITH: Harrold Donath [id number] 12/25/22 RH    Staphylococcus aureus (BCID) NOT DETECTED NOT DETECTED Final   Staphylococcus epidermidis NOT DETECTED NOT DETECTED Final   Staphylococcus lugdunensis NOT  DETECTED NOT DETECTED Final   Streptococcus species NOT DETECTED NOT DETECTED Final   Streptococcus agalactiae NOT DETECTED NOT DETECTED Final   Streptococcus pneumoniae NOT DETECTED NOT DETECTED Final   Streptococcus pyogenes NOT DETECTED NOT DETECTED Final   A.calcoaceticus-baumannii NOT DETECTED NOT DETECTED Final   Bacteroides fragilis NOT DETECTED NOT DETECTED Final   Enterobacterales NOT DETECTED NOT DETECTED Final   Enterobacter cloacae complex NOT DETECTED NOT DETECTED Final   Escherichia coli NOT DETECTED NOT DETECTED Final   Klebsiella aerogenes NOT DETECTED NOT DETECTED Final   Klebsiella oxytoca NOT DETECTED NOT DETECTED Final   Klebsiella pneumoniae NOT DETECTED NOT DETECTED Final   Proteus species NOT DETECTED NOT DETECTED Final   Salmonella species NOT DETECTED NOT DETECTED Final   Serratia marcescens NOT DETECTED NOT DETECTED Final   Haemophilus influenzae NOT DETECTED NOT DETECTED Final   Neisseria meningitidis NOT DETECTED NOT DETECTED Final   Pseudomonas aeruginosa NOT DETECTED NOT DETECTED Final   Stenotrophomonas maltophilia NOT DETECTED NOT DETECTED Final   Candida albicans NOT DETECTED NOT DETECTED Final   Candida auris NOT DETECTED NOT DETECTED Final   Candida glabrata NOT DETECTED NOT DETECTED Final   Candida krusei NOT DETECTED NOT DETECTED Final   Candida parapsilosis NOT DETECTED NOT DETECTED Final   Candida tropicalis NOT DETECTED NOT DETECTED Final   Cryptococcus neoformans/gattii NOT DETECTED NOT DETECTED Final    Comment: Performed at [facility], [address], [place], [place] [address]         Radiology Studies: DG Chest Portable 1 View  Result Date: 12/24/2022 CLINICAL DATA:  Altered mental status.  Weakness. EXAM: PORTABLE CHEST 1 VIEW COMPARISON:  AP chest 10/16/2017, CT chest 10/16/2017 FINDINGS: Cardiac silhouette and mediastinal contours are within normal limits. Mild calcification within the aortic arch. There is again  flattening of the diaphragms. Calcified pleural plaques are again seen, seen predominantly on prior CT within the anterior superior right-greater-than-left and also the posterosuperior left-greater-than-right regions. Additional right hemidiaphragm basilar calcified pleural plaque. Mild bilateral chronic interstitial thickening. No definite new acute airspace opacity is visualized. No definite pleural effusion. No pneumothorax. Moderate bilateral glenohumeral osteoarthritis. IMPRESSION: 1. No acute cardiopulmonary disease. 2. Chronic calcified pleural plaques. Electronically Signed   By: Neita Garnet M.D.   On: 12/24/2022 13:05        Scheduled Meds:  heparin  5,000 Units Subcutaneous Q8H   insulin aspart  0-5 Units Subcutaneous QHS   insulin aspart  0-9 Units Subcutaneous TID WC   nystatin   Topical TID   Continuous Infusions:  azithromycin Stopped (12/25/22 0108)   cefTRIAXone (ROCEPHIN)  IV     lactated ringers 150 mL/hr (12/25/22 0300)  LOS: 1 day    Time spent: 35 mins     Charise Killian, MD [facility] Pager [phone]  If 7PM-7AM, please contact night-coverage [email or web address] 12/25/2022, 8:54 AM

## 2022-12-25 NOTE — ED Notes (Signed)
Pt ambulated to bedside toilet, 2 person assist. Pt had small BM and returned to bed.

## 2022-12-25 NOTE — ED Notes (Signed)
Pt up to bedside toilet with 2 person assist to have BM.

## 2022-12-25 NOTE — ED Notes (Signed)
Family at bedside. 

## 2022-12-25 NOTE — ED Notes (Signed)
Pt transferred to hospital bed at this time, bed alarm on, call bell within reach

## 2022-12-25 NOTE — Progress Notes (Signed)
PHARMACY - PHYSICIAN COMMUNICATION CRITICAL VALUE ALERT - BLOOD CULTURE IDENTIFICATION (BCID)  Adam [name] is an 89 y.o. male who presented to [facility] on 12/24/2022 with a chief complaint of wandering.  Assessment: 1/4 staph species  Name of physician (or Provider) Contacted: Dr. Mayford Knife  Current antibiotics: cefepime/vancomycin/metronidazole/azithromycin  Changes to prescribed antibiotics: MD discontinued cefepime, vancomycin, and metronidazole and switched to ceftriaxone/azithromycin   Results for orders placed or performed during the hospital encounter of 12/24/22  Blood Culture ID Panel (Reflexed) (Collected: 12/24/2022  2:07 PM)  Result Value Ref Range   Enterococcus faecalis NOT DETECTED NOT DETECTED   Enterococcus Faecium NOT DETECTED NOT DETECTED   Listeria monocytogenes NOT DETECTED NOT DETECTED   Staphylococcus species DETECTED (A) NOT DETECTED   Staphylococcus aureus (BCID) NOT DETECTED NOT DETECTED   Staphylococcus epidermidis NOT DETECTED NOT DETECTED   Staphylococcus lugdunensis NOT DETECTED NOT DETECTED   Streptococcus species NOT DETECTED NOT DETECTED   Streptococcus agalactiae NOT DETECTED NOT DETECTED   Streptococcus pneumoniae NOT DETECTED NOT DETECTED   Streptococcus pyogenes NOT DETECTED NOT DETECTED   A.calcoaceticus-baumannii NOT DETECTED NOT DETECTED   Bacteroides fragilis NOT DETECTED NOT DETECTED   Enterobacterales NOT DETECTED NOT DETECTED   Enterobacter cloacae complex NOT DETECTED NOT DETECTED   Escherichia coli NOT DETECTED NOT DETECTED   Klebsiella aerogenes NOT DETECTED NOT DETECTED   Klebsiella oxytoca NOT DETECTED NOT DETECTED   Klebsiella pneumoniae NOT DETECTED NOT DETECTED   Proteus species NOT DETECTED NOT DETECTED   Salmonella species NOT DETECTED NOT DETECTED   Serratia marcescens NOT DETECTED NOT DETECTED   Haemophilus influenzae NOT DETECTED NOT DETECTED   Neisseria meningitidis NOT DETECTED NOT DETECTED   Pseudomonas  aeruginosa NOT DETECTED NOT DETECTED   Stenotrophomonas maltophilia NOT DETECTED NOT DETECTED   Candida albicans NOT DETECTED NOT DETECTED   Candida auris NOT DETECTED NOT DETECTED   Candida glabrata NOT DETECTED NOT DETECTED   Candida krusei NOT DETECTED NOT DETECTED   Candida parapsilosis NOT DETECTED NOT DETECTED   Candida tropicalis NOT DETECTED NOT DETECTED   Cryptococcus neoformans/gattii NOT DETECTED NOT DETECTED   Littie Deeds, PharmD PGY1 Pharmacy Resident 12/25/2022  8:35 AM

## 2022-12-25 NOTE — ED Notes (Signed)
Pt to be transported to room 251 by NT. Stable at time of departure from ED.

## 2022-12-26 ENCOUNTER — Inpatient Hospital Stay: Payer: HMO

## 2022-12-26 DIAGNOSIS — F03918 Unspecified dementia, unspecified severity, with other behavioral disturbance: Secondary | ICD-10-CM

## 2022-12-26 DIAGNOSIS — A419 Sepsis, unspecified organism: Secondary | ICD-10-CM | POA: Diagnosis not present

## 2022-12-26 DIAGNOSIS — R410 Disorientation, unspecified: Secondary | ICD-10-CM

## 2022-12-26 LAB — HEMOGLOBIN AND HEMATOCRIT, BLOOD
HCT: 39.5 % (ref 39.0–52.0)
Hemoglobin: 13.5 g/dL (ref 13.0–17.0)

## 2022-12-26 LAB — CBC
HCT: 41.3 % (ref 39.0–52.0)
Hemoglobin: 13.6 g/dL (ref 13.0–17.0)
MCH: 31.8 pg (ref 26.0–34.0)
MCHC: 32.9 g/dL (ref 30.0–36.0)
MCV: 96.5 fL (ref 80.0–100.0)
Platelets: 162 10*3/uL (ref 150–400)
RBC: 4.28 MIL/uL (ref 4.22–5.81)
RDW: 13 % (ref 11.5–15.5)
WBC: 11.4 10*3/uL — ABNORMAL HIGH (ref 4.0–10.5)
nRBC: 0 % (ref 0.0–0.2)

## 2022-12-26 LAB — BASIC METABOLIC PANEL
Anion gap: 11 (ref 5–15)
BUN: 24 mg/dL — ABNORMAL HIGH (ref 8–23)
CO2: 24 mmol/L (ref 22–32)
Calcium: 9 mg/dL (ref 8.9–10.3)
Chloride: 106 mmol/L (ref 98–111)
Creatinine, Ser: 1.14 mg/dL (ref 0.61–1.24)
GFR, Estimated: >60 mL/min (ref >60)
Glucose, Bld: 67 mg/dL — ABNORMAL LOW (ref 70–99)
Potassium: 3.9 mmol/L (ref 3.5–5.1)
Sodium: 141 mmol/L (ref 135–145)

## 2022-12-26 LAB — URINE CULTURE: Culture: NO GROWTH

## 2022-12-26 LAB — GLUCOSE, CAPILLARY
Glucose-Capillary: 70 mg/dL (ref 70–99)
Glucose-Capillary: 102 mg/dL — ABNORMAL HIGH (ref 70–99)
Glucose-Capillary: 95 mg/dL (ref 70–99)

## 2022-12-26 MED ORDER — SCOPOLAMINE 1 MG/3DAYS TD PT72
1.0000 | MEDICATED_PATCH | TRANSDERMAL | Status: DC
  Administered 2022-12-26 – 2023-01-04 (×4): 1.5 mg via TRANSDERMAL
  Filled 2022-12-26 (×4): qty 1

## 2022-12-26 MED ORDER — CHLORHEXIDINE GLUCONATE CLOTH 2 % EX PADS
6.0000 | MEDICATED_PAD | Freq: Every day | CUTANEOUS | Status: DC
  Administered 2022-12-26 – 2023-01-06 (×12): 6 via TOPICAL

## 2022-12-26 NOTE — Progress Notes (Addendum)
Report given to [place], Charity fundraiser.Marland Kitchen Patient transferred to 116 with belongings on bed accompanied by transporter. At time of transport patient alert and oriented x1 self only. VSS, following simple commands. 2 PIVs on bilateral arms. Foley attached. Mitts on for safety, patient pulling at lines and gown. Meds sent to 1C via tubing station. Care transferred to [place], [place]

## 2022-12-26 NOTE — Progress Notes (Signed)
    Palliative Medicine Team    Palliative Medicine consult noted. Due to high referral volume, there will be a delay seeing this patient.   A provider will be available on site Monday, September 9th.  Please utilize AMION for PMT providers off site if recommendations are needed in the interim.   Thank you for inviting [place] to participate in the care of this patient.  Palliative Medicine Team

## 2022-12-26 NOTE — Procedures (Signed)
Modified Barium Swallow Study  Patient Details  Name: Adam [name] MRN: [id number] Date of Birth: 1929-06-16  Today's Date: 12/26/2022  Modified Barium Swallow completed.  Full report located under Chart Review in the Imaging Section.  History of Present Illness Pt is a 89 year old male with history of Dementia, hyperlipidemia, hypertension, GERD, who presents to the emergency department for chief concerns of altered mental status.  Per ED documentation, patient was found [place] the woods wandering by his neighbor.     Vitals in the ED showed temperature of 95.4, improved to 97.7 with bear hugger, respiration rate 21, heart rate of 86, blood pressure 150/93, SpO2 97% on room air. Serum sodium is 138, potassium 4.7, chloride 102.  CXR: No acute cardiopulmonary disease.  2. Chronic calcified pleural plaques.   Clinical Impression Pt presents with profound oropharyngeal dysphagia that appears to be sensorimotor in nature resulting in silent and sensed aspiration of thin liquids and nectar thick liquids when consuming via spoon.   Pt's oral phase is c/b decreased bolus cohesion, weak AP tranfer with premature passive spillage of boluses into pharynx and widespread oral residue post swallow.   Pt's pharyngeal response is significantly weak with inability to achieve airway protection. Gross amounts of sensed and silent aspiration are observed during and following swallow initiation. In addition to sensed and silent aspiration, pt observed with significant pharyngeal residue that he is not able to clear despite numerous cued swallows.   At this time, a safe PO diet cannot be recommended.  Given the nature of pt's oropharyngela impairment, a feeding tube is not likely to reduce pt's risk of aspirating his own salvia.   Recommend proactive oral care to reduce bacterial load of salvia and consult to Palliative Care to determine GOC.  Factors that may increase risk of adverse event in presence of  aspiration [employer] 2021): Reduced cognitive function;Frail or deconditioned;Dependence for feeding and/or oral hygiene;Weak cough;Frequent aspiration of large volumes  Swallow Evaluation Recommendations Recommendations: NPO Medication Administration: Via alternative means Oral care recommendations: Oral care QID (4x/day) Recommended consults: Consider Palliative care   Anum Palecek B. Dreama Saa, M.S., CCC-SLP, Tree surgeon Certified Brain Injury Specialist [facility]  [facility] Office [phone] Ascom [phone] Fax [phone]

## 2022-12-26 NOTE — Progress Notes (Addendum)
PROGRESS NOTE    Adam [name]  KGM:[id number] DOB: 07-07-1929 DOA: 12/24/2022 PCP: Marina Goodell, MD   Assessment & Plan:   Principal Problem:   Sepsis [facility]) Active Problems:   Essential hypertension   Gastroesophageal reflux disease without esophagitis   Primary osteoarthritis of left knee   Primary osteoarthritis of right knee   Pure hypercholesterolemia   Type 2 diabetes mellitus with stage 3 chronic kidney disease, without long-term current use of insulin (HCC)   AMS (altered mental status)   Elevated serum creatinine   Candida rash of groin  Assessment and Plan:  Sepsis: met criteria w/ leukocytosis, tachypnea, hypothermia but unknown source, possibly UTI. Continue on IV ceftriaxone, azithromycin. Procal 2.37  Unlikely bacteremia: blood cxs growing staph, likely containment. Repeat blood cxs NGTD  Dementia: oriented to self & place only. Continue w/ supportive care. Palliative care consulted  Dysphagia: likely secondary to dementia. Strict NPO as per speech but started on soft diet for comfort as per pt's family request   Hematuria: likely secondary to pt pulling on cath tubing. H&H are WNL. Will order repeat H&H    Candida rash of groin: continue on nystatin powder   Acute encephalopathy: etiology unclear, infection vs delirium vs worsening dementia. Re-orient prn. Continue w/ supportive care    DM2: likely well controlled. Continue on SSI w/ accuchecks   Likely AKI: baseline Cr/GFR is unknown.Cr is labile. Avoid nephrotoxic meds   Urinary retention: foley placed in ER 12/25/22. Urine cx ordered secondary c/o dysuria & meeting criteria for sepsis. Urine cx shows no growth but abxs were given prior to urine being collected for urine cx    HLD: continue on statin   HTN: no longer taking hydrochlorothiazide,enalapril. IV hydralazine prn        DVT prophylaxis: heparin  Code Status: DNR  Family Communication: discussed pt's care w/ pt's son, Antaeus, and  answered his questions  Disposition Plan: unclear  Level of care: Progressive Status is: Inpatient Remains inpatient appropriate because: severity of illness, requiring IV abxs    Consultants:  Palliative care ID   Procedures:   Antimicrobials: rocephin, azithromycin    Subjective: Pt c/o fatigue   Objective: Vitals:   12/25/22 2352 12/26/22 0423 12/26/22 0437 12/26/22 0728  BP: 131/68  119/61 125/67  Pulse: 83  88 84  Resp: 18  16 16   Temp: 98.3 F (36.8 C)  98.4 F (36.9 C) 98.2 F (36.8 C)  TempSrc: Oral  Oral Oral  SpO2: 96%  93% 97%  Weight:  64.6 kg    Height:        Intake/Output Summary (Last 24 hours) at 12/26/2022 0902 Last data filed at 12/26/2022 0855 Gross per 24 hour  Intake 2631.52 ml  Output 3100 ml  Net -468.48 ml   Filed Weights   12/24/22 0900 12/26/22 0423  Weight: 68 kg 64.6 kg    Examination:  General exam: Appears comfortable  Respiratory system: clear breath sounds b/l  Cardiovascular system: S1/S2+. No rubs or clicks  Gastrointestinal system: abd is soft, NT, ND & normal bowel sounds  Central nervous system: alert & oriented to self & place only  Psychiatry: Judgement and insight appears close to baseline. Flat mood and affect    Data Reviewed: I have personally reviewed following labs and imaging studies  CBC: Recent Labs  Lab 12/24/22 0857 12/25/22 0453 12/26/22 0501  WBC 21.1* 10.6* 11.4*  NEUTROABS 18.7*  --   --   HGB 14.5 13.9  13.6  HCT 43.7 42.0 41.3  MCV 96.0 96.3 96.5  PLT 214 164 162   Basic Metabolic Panel: Recent Labs  Lab 12/24/22 0857 12/25/22 0453 12/26/22 0501  NA 138 138 141  K 4.7 3.8 3.9  CL 102 105 106  CO2 24 23 24   GLUCOSE 111* 88 67*  BUN 35* 24* 24*  CREATININE 1.60* 1.08 1.14  CALCIUM 9.4 8.6* 9.0   GFR: Estimated Creatinine Clearance: 38.6 mL/min (by C-G formula based on SCr of 1.14 mg/dL). Liver Function Tests: No results for input(s): "AST", "ALT", "ALKPHOS", "BILITOT",  "PROT", "ALBUMIN" in the last 168 hours. No results for input(s): "LIPASE", "AMYLASE" in the last 168 hours. No results for input(s): "AMMONIA" in the last 168 hours. Coagulation Profile: No results for input(s): "INR", "PROTIME" in the last 168 hours. Cardiac Enzymes: Recent Labs  Lab 12/24/22 0857  CKTOTAL 854*   BNP (last 3 results) No results for input(s): "PROBNP" in the last 8760 hours. HbA1C: Recent Labs    12/25/22 0453  HGBA1C 6.2*   CBG: Recent Labs  Lab 12/25/22 0844 12/25/22 1157 12/25/22 1723 12/25/22 2103 12/26/22 0833  GLUCAP 82 75 82 78 70   Lipid Profile: No results for input(s): "CHOL", "HDL", "LDLCALC", "TRIG", "CHOLHDL", "LDLDIRECT" in the last 72 hours. Thyroid Function Tests: No results for input(s): "TSH", "T4TOTAL", "FREET4", "T3FREE", "THYROIDAB" in the last 72 hours. Anemia Panel: No results for input(s): "VITAMINB12", "FOLATE", "FERRITIN", "TIBC", "IRON", "RETICCTPCT" in the last 72 hours. Sepsis Labs: Recent Labs  Lab 12/24/22 0857 12/24/22 1138 12/25/22 0453  PROCALCITON 0.69  --  2.37  LATICACIDVEN  --  1.1  --     Recent Results (from the past 240 hour(s))  SARS Coronavirus 2 by RT PCR (hospital order, performed in [facility] hospital lab) *cepheid single result test* Anterior Nasal Swab     Status: None   Collection Time: 12/24/22  2:06 PM   Specimen: Anterior Nasal Swab  Result Value Ref Range Status   SARS Coronavirus 2 by RT PCR NEGATIVE NEGATIVE Final    Comment: (NOTE) SARS-CoV-2 target nucleic acids are NOT DETECTED.  The SARS-CoV-2 RNA is generally detectable in upper and lower respiratory specimens during the acute phase of infection. The lowest concentration of SARS-CoV-2 viral copies this assay can detect is 250 copies / mL. A negative result does not preclude SARS-CoV-2 infection and should not be used as the sole basis for treatment or other patient management decisions.  A negative result may occur with improper  specimen collection / handling, submission of specimen other than nasopharyngeal swab, presence of viral mutation(s) within the areas targeted by this assay, and inadequate number of viral copies (<250 copies / mL). A negative result must be combined with clinical observations, patient history, and epidemiological information.  Fact Sheet for Patients:   RoadLapTop.co.za  Fact Sheet for Healthcare Providers: http://kim-miller.com/  This test is not yet approved or  cleared by the [place] FDA and has been authorized for detection and/or diagnosis of SARS-CoV-2 by FDA under an Emergency Use Authorization (EUA).  This EUA will remain in effect (meaning this test can be used) for the duration of the COVID-19 declaration under Section 564(b)(1) of the Act, 21 U.S.C. section 360bbb-3(b)(1), unless the authorization is terminated or revoked sooner.  Performed at [facility], [address] [place], [place] [address]   Blood culture (routine x 2)     Status: None (Preliminary result)   Collection Time: 12/24/22  2:06  PM   Specimen: BLOOD  Result Value Ref Range Status   Specimen Description BLOOD BLOOD LEFT FOREARM  Final   Special Requests   Final    BOTTLES DRAWN AEROBIC AND ANAEROBIC Blood Culture results may not be optimal due to an inadequate volume of blood received in culture bottles   Culture   Final    NO GROWTH 2 DAYS Performed at [facility], [address], [place], [place] [address]    Report Status PENDING  Incomplete  Blood culture (routine x 2)     Status: None (Preliminary result)   Collection Time: 12/24/22  2:07 PM   Specimen: BLOOD  Result Value Ref Range Status   Specimen Description BLOOD BLOOD RIGHT FOREARM  Final   Special Requests   Final    BOTTLES DRAWN AEROBIC AND ANAEROBIC Blood Culture results may not be optimal due to an inadequate volume of blood received in culture bottles    Culture  Setup Time   Final    GRAM POSITIVE COCCI IN BOTH AEROBIC AND ANAEROBIC BOTTLES Organism ID to follow CRITICAL RESULT CALLED TO, READ BACK BY AND VERIFIED WITH: NATHAN [email or web address]  12/25/22 RH Performed at [facility], [address] [place], [place] [address]    Culture GRAM POSITIVE COCCI  Final   Report Status PENDING  Incomplete  Blood Culture ID Panel (Reflexed)     Status: Abnormal   Collection Time: 12/24/22  2:07 PM  Result Value Ref Range Status   Enterococcus faecalis NOT DETECTED NOT DETECTED Final   Enterococcus Faecium NOT DETECTED NOT DETECTED Final   Listeria monocytogenes NOT DETECTED NOT DETECTED Final   Staphylococcus species DETECTED (A) NOT DETECTED Final    Comment: CRITICAL RESULT CALLED TO, READ BACK BY AND VERIFIED WITH: Harrold Donath [id number] 12/25/22 RH    Staphylococcus aureus (BCID) NOT DETECTED NOT DETECTED Final   Staphylococcus epidermidis NOT DETECTED NOT DETECTED Final   Staphylococcus lugdunensis NOT DETECTED NOT DETECTED Final   Streptococcus species NOT DETECTED NOT DETECTED Final   Streptococcus agalactiae NOT DETECTED NOT DETECTED Final   Streptococcus pneumoniae NOT DETECTED NOT DETECTED Final   Streptococcus pyogenes NOT DETECTED NOT DETECTED Final   A.calcoaceticus-baumannii NOT DETECTED NOT DETECTED Final   Bacteroides fragilis NOT DETECTED NOT DETECTED Final   Enterobacterales NOT DETECTED NOT DETECTED Final   Enterobacter cloacae complex NOT DETECTED NOT DETECTED Final   Escherichia coli NOT DETECTED NOT DETECTED Final   Klebsiella aerogenes NOT DETECTED NOT DETECTED Final   Klebsiella oxytoca NOT DETECTED NOT DETECTED Final   Klebsiella pneumoniae NOT DETECTED NOT DETECTED Final   Proteus species NOT DETECTED NOT DETECTED Final   Salmonella species NOT DETECTED NOT DETECTED Final   Serratia marcescens NOT DETECTED NOT DETECTED Final   Haemophilus influenzae NOT DETECTED NOT DETECTED Final   Neisseria meningitidis NOT DETECTED  NOT DETECTED Final   Pseudomonas aeruginosa NOT DETECTED NOT DETECTED Final   Stenotrophomonas maltophilia NOT DETECTED NOT DETECTED Final   Candida albicans NOT DETECTED NOT DETECTED Final   Candida auris NOT DETECTED NOT DETECTED Final   Candida glabrata NOT DETECTED NOT DETECTED Final   Candida krusei NOT DETECTED NOT DETECTED Final   Candida parapsilosis NOT DETECTED NOT DETECTED Final   Candida tropicalis NOT DETECTED NOT DETECTED Final   Cryptococcus neoformans/gattii NOT DETECTED NOT DETECTED Final    Comment: Performed at [facility], [address] [place], [place] [address]  Culture, blood (Routine X 2) w Reflex to ID Panel  Status: None (Preliminary result)   Collection Time: 12/25/22  9:19 AM   Specimen: BLOOD LEFT HAND  Result Value Ref Range Status   Specimen Description BLOOD LEFT HAND  Final   Special Requests   Final    BOTTLES DRAWN AEROBIC AND ANAEROBIC Blood Culture adequate volume   Culture   Final    NO GROWTH < 24 HOURS Performed at [facility], [address], [place], [place] [address]    Report Status PENDING  Incomplete  Culture, blood (Routine X 2) w Reflex to ID Panel     Status: None (Preliminary result)   Collection Time: 12/25/22  9:27 AM   Specimen: BLOOD LEFT HAND  Result Value Ref Range Status   Specimen Description BLOOD LEFT HAND  Final   Special Requests   Final    BOTTLES DRAWN AEROBIC AND ANAEROBIC Blood Culture adequate volume   Culture   Final    NO GROWTH < 24 HOURS Performed at [facility], [address], [place], [place] [address]    Report Status PENDING  Incomplete         Radiology Studies: DG Swallowing Func-Speech Pathology  Result Date: 12/26/2022 Table formatting from the original result was not included. Modified Barium Swallow Study Patient Details Name: GAMALIEL LUNDIE MRN: [id number] Date of Birth: 03-29-1930 Today's Date: 12/26/2022 HPI/PMH: HPI: Pt is a 89 year old male with  history of Dementia, hyperlipidemia, hypertension, GERD, who presents to the emergency department for chief concerns of altered mental status.  Per ED documentation, patient was [place] the [place] wandering by his neighbor.     Vitals in the ED showed temperature of 95.4, improved to 97.7 with bear hugger, respiration rate 21, heart rate of 86, blood pressure 150/93, SpO2 97% on room air. Serum sodium is 138, potassium 4.7, chloride 102.  CXR: No acute cardiopulmonary disease.  2. Chronic calcified pleural plaques. Clinical Impression: Clinical Impression: Pt presents with profound oropharyngeal dysphagia that appears to be sensorimotor in nature resulting in silent and sensed aspiration of thin liquids and nectar thick liquids when consuming via spoon. Pt's oral phase is c/b decreased bolus cohesion, weak AP tranfer with premature passive spillage of boluses into pharynx and widespread oral residue post swallow. Pt's pharyngeal response is significantly weak with inability to achieve airway protection. Gross amounts of sensed and silent aspiration are observed during and following swallow initiation. In addition to sensed and silent aspiration, pt observed with significant pharyngeal residue that he is not able to clear despite numerous cued swallows. At this time, a safe PO diet cannot be recommended.  Given the nature of pt's oropharyngela impairment, a feeding tube is not likely to reduce pt's risk of aspirating his own salvia. Recommend proactive oral care to reduce bacterial load of salvia and consult to Palliative Care to determine GOC. Factors that may increase risk of adverse event in presence of aspiration Rubye Oaks & Clearance Coots 2021): Factors that may increase risk of adverse event in presence of aspiration Rubye Oaks & Clearance Coots 2021): Reduced cognitive function; Frail or deconditioned; Dependence for feeding and/or oral hygiene; Weak cough; Frequent aspiration of large volumes Recommendations/Plan: Swallowing  Evaluation Recommendations Swallowing Evaluation Recommendations Recommendations: NPO Medication Administration: Via alternative means Oral care recommendations: Oral care QID (4x/day) Recommended consults: Consider Palliative care Caregiver Recommendations: -- (TBD) Treatment Plan Treatment Plan Treatment recommendations: No treatment recommended at this time Follow-up recommendations: No SLP follow up Recommendations Comment: consult of Palliative Care Functional status assessment: Patient has had  a recent decline in their functional status and/or demonstrates limited ability to make significant improvements in function in a reasonable and predictable amount of time. Recommendations Recommendations for follow up therapy are one component of a multi-disciplinary discharge planning process, led by the attending physician.  Recommendations may be updated based on patient status, additional functional criteria and insurance authorization. Assessment: Orofacial Exam: Orofacial Exam Oral Cavity: Oral Hygiene: WFL Oral Cavity - Dentition: Edentulous (has full set of dentures - not available at this time) Orofacial Anatomy: WFL Oral Motor/Sensory Function: Generalized oral weakness Anatomy: Anatomy: WFL Boluses Administered: Boluses Administered Boluses Administered: Thin liquids (Level 0); Mildly thick liquids (Level 2, nectar thick)  Oral Impairment Domain: Oral Impairment Domain Lip Closure: No labial escape Tongue control during bolus hold: Not tested (pt unable to achieve) Bolus transport/lingual motion: Repetitive/disorganized tongue motion Oral residue: Residue collection on oral structures; Majority of bolus remaining Location of oral residue : Lateral sulci; Floor of mouth; Tongue Initiation of pharyngeal swallow : Pyriform sinuses  Pharyngeal Impairment Domain: Pharyngeal Impairment Domain Soft palate elevation: No bolus between soft palate (SP)/pharyngeal wall (PW) Laryngeal elevation: Minimal superior movement  of thyroid cartilage with minimal approximation of arytenoids to epiglottic petiole; No superior movement of thyroid cartilage Anterior hyoid excursion: Partial anterior movement; No anterior movement Epiglottic movement: No inversion Laryngeal vestibule closure: None, wide column air/contrast in laryngeal vestibule; Incomplete, narrow column air/contrast in laryngeal vestibule Pharyngeal stripping wave : Present - diminished Pharyngoesophageal segment opening: Complete distension and complete duration, no obstruction of flow Tongue base retraction: Trace column of contrast or air between tongue base and PPW; Narrow column of contrast or air between tongue base and PPW Pharyngeal residue: Majority of contrast within or on pharyngeal structures; Minimal to no pharyngeal clearance Location of pharyngeal residue: Valleculae; Pharyngeal wall  Esophageal Impairment Domain: Esophageal Impairment Domain Esophageal clearance upright position: Complete clearance, esophageal coating Pill: No data recorded Penetration/Aspiration Scale Score: Penetration/Aspiration Scale Score 4.  Material enters airway, CONTACTS cords then ejected out: Thin liquids (Level 0); Mildly thick liquids (Level 2, nectar thick) 5.  Material enters airway, CONTACTS cords and not ejected out: Thin liquids (Level 0); Mildly thick liquids (Level 2, nectar thick) 6.  Material enters airway, passes BELOW cords then ejected out: Thin liquids (Level 0); Mildly thick liquids (Level 2, nectar thick) 7.  Material enters airway, passes BELOW cords and not ejected out despite cough attempt by patient: Thin liquids (Level 0); Mildly thick liquids (Level 2, nectar thick) 8.  Material enters airway, passes BELOW cords without attempt by patient to eject out (silent aspiration) : Thin liquids (Level 0); Mildly thick liquids (Level 2, nectar thick) Compensatory Strategies: No data recorded  General Information: Caregiver present: No  Diet Prior to this Study: NPO    Temperature : Normal   Respiratory Status: WFL   Supplemental O2: None (Room air)   History of Recent Intubation: No  Behavior/Cognition: Alert; Cooperative; Pleasant mood; Confused; Distractible; Requires cueing (baseline dementia, he is able to follow instructions for multiple swallows) Self-Feeding Abilities: Able to self-feed; Needs assist with self-feeding Baseline vocal quality/speech: Normal Volitional Cough: Unable to elicit Volitional Swallow: Unable to elicit Exam Limitations: No limitations Goal Planning: Prognosis for improved oropharyngeal function: Guarded Barriers to Reach Goals: Cognitive deficits; Severity of deficits; Overall medical prognosis Barriers/Prognosis Comment: profound sensed and silent aspiration, dementia, GERD Patient/Family Stated Goal: none stated Consulted and agree with results and recommendations: Pt unable/family or caregiver not available; Physician; Nurse Pain: Pain Assessment  Pain Assessment: Faces Faces Pain Scale: 0 End of Session: Start Time:SLP Start Time (ACUTE ONLY): 0755 Stop Time: SLP Stop Time (ACUTE ONLY): 0810 Time Calculation:SLP Time Calculation (min) (ACUTE ONLY): 15 min Charges: SLP Evaluations $ SLP Speech Visit: 1 Visit SLP Evaluations $BSS Swallow: 1 Procedure $MBS Swallow: 1 Procedure SLP visit diagnosis: SLP Visit Diagnosis: Dysphagia, oropharyngeal phase (R13.12); Dysphagia, pharyngoesophageal phase (R13.14) Past Medical History: Past Medical History: Diagnosis Date  Acute prostatitis   BPH (benign prostatic hypertrophy)   Dysphagia   Elevated PSA   Esophageal reflux   Frequency   HTN (hypertension)   Nocturia   Osteoarthritis   Unilateral inguinal hernia   Urgency of micturation  Past Surgical History: Past Surgical History: Procedure Laterality Date  CATARACT EXTRACTION Bilateral   HERNIA REPAIR    bilateral inguinal  LEG SURGERY    leg trauma as a child Happi Overton 12/26/2022, 8:43 AM  DG Chest Portable 1 View  Result Date: 12/24/2022 CLINICAL DATA:   Altered mental status.  Weakness. EXAM: PORTABLE CHEST 1 VIEW COMPARISON:  AP chest 10/16/2017, CT chest 10/16/2017 FINDINGS: Cardiac silhouette and mediastinal contours are within normal limits. Mild calcification within the aortic arch. There is again flattening of the diaphragms. Calcified pleural plaques are again seen, seen predominantly on prior CT within the anterior superior right-greater-than-left and also the posterosuperior left-greater-than-right regions. Additional right hemidiaphragm basilar calcified pleural plaque. Mild bilateral chronic interstitial thickening. No definite new acute airspace opacity is visualized. No definite pleural effusion. No pneumothorax. Moderate bilateral glenohumeral osteoarthritis. IMPRESSION: 1. No acute cardiopulmonary disease. 2. Chronic calcified pleural plaques. Electronically Signed   By: Neita Garnet M.D.   On: 12/24/2022 13:05        Scheduled Meds:  Chlorhexidine Gluconate Cloth  6 each Topical Daily   heparin  5,000 Units Subcutaneous Q8H   influenza vaccine adjuvanted  0.5 mL Intramuscular Tomorrow-1000   insulin aspart  0-5 Units Subcutaneous QHS   insulin aspart  0-9 Units Subcutaneous TID WC   nystatin   Topical TID   pantoprazole (PROTONIX) IV  40 mg Intravenous Q24H   Continuous Infusions:  azithromycin Stopped (12/25/22 2340)   cefTRIAXone (ROCEPHIN)  IV 1 g (12/26/22 0839)     LOS: 2 days       Charise Killian, MD [facility] Pager [phone]  If 7PM-7AM, please contact night-coverage [email or web address] 12/26/2022, 9:02 AM

## 2022-12-27 DIAGNOSIS — A419 Sepsis, unspecified organism: Secondary | ICD-10-CM | POA: Diagnosis not present

## 2022-12-27 LAB — GLUCOSE, CAPILLARY
Glucose-Capillary: 80 mg/dL (ref 70–99)
Glucose-Capillary: 73 mg/dL (ref 70–99)
Glucose-Capillary: 83 mg/dL (ref 70–99)
Glucose-Capillary: 79 mg/dL (ref 70–99)

## 2022-12-27 LAB — CBC
HCT: 36.6 % — ABNORMAL LOW (ref 39.0–52.0)
Hemoglobin: 12.5 g/dL — ABNORMAL LOW (ref 13.0–17.0)
MCH: 32 pg (ref 26.0–34.0)
MCHC: 34.2 g/dL (ref 30.0–36.0)
MCV: 93.6 fL (ref 80.0–100.0)
Platelets: 145 K/uL — ABNORMAL LOW (ref 150–400)
RBC: 3.91 MIL/uL — ABNORMAL LOW (ref 4.22–5.81)
RDW: 13.1 % (ref 11.5–15.5)
WBC: 10 K/uL (ref 4.0–10.5)
nRBC: 0 % (ref 0.0–0.2)

## 2022-12-27 LAB — BASIC METABOLIC PANEL WITH GFR
Anion gap: 9 (ref 5–15)
BUN: 30 mg/dL — ABNORMAL HIGH (ref 8–23)
CO2: 27 mmol/L (ref 22–32)
Calcium: 8.8 mg/dL — ABNORMAL LOW (ref 8.9–10.3)
Chloride: 103 mmol/L (ref 98–111)
Creatinine, Ser: 1.08 mg/dL (ref 0.61–1.24)
GFR, Estimated: >60 mL/min
Glucose, Bld: 86 mg/dL (ref 70–99)
Potassium: 3.3 mmol/L — ABNORMAL LOW (ref 3.5–5.1)
Sodium: 139 mmol/L (ref 135–145)

## 2022-12-27 LAB — CULTURE, BLOOD (ROUTINE X 2)

## 2022-12-27 MED ORDER — POTASSIUM CHLORIDE 20 MEQ PO PACK
40.0000 meq | PACK | Freq: Once | ORAL | Status: DC
  Filled 2022-12-27: qty 2

## 2022-12-27 MED ORDER — GLYCOPYRROLATE 0.2 MG/ML IJ SOLN
0.2000 mg | Freq: Four times a day (QID) | INTRAMUSCULAR | Status: DC | PRN
  Administered 2022-12-27: 0.2 mg via INTRAVENOUS
  Filled 2022-12-27: qty 1

## 2022-12-27 MED ORDER — AZITHROMYCIN 500 MG PO TABS: 500.0000 mg | ORAL_TABLET | Freq: Every day | ORAL | Status: DC

## 2022-12-27 MED ORDER — SODIUM CHLORIDE 0.9 % IV SOLN
500.0000 mg | Freq: Every day | INTRAVENOUS | Status: AC
  Administered 2022-12-27 – 2022-12-28 (×2): 500 mg via INTRAVENOUS
  Filled 2022-12-27 (×2): qty 5

## 2022-12-27 MED ORDER — POTASSIUM CHLORIDE 10 MEQ/100ML IV SOLN
10.0000 meq | INTRAVENOUS | Status: AC
  Administered 2022-12-27 (×3): 10 meq via INTRAVENOUS
  Filled 2022-12-27 (×2): qty 100

## 2022-12-27 NOTE — Progress Notes (Deleted)
PHARMACIST - PHYSICIAN COMMUNICATION  CONCERNING: Antibiotic IV to Oral Route Change Policy  RECOMMENDATION: This patient is receiving azithromycin by the intravenous route.  Based on criteria approved by the Pharmacy and Therapeutics Committee, the antibiotic(s) is/are being converted to the equivalent oral dose form(s).  DESCRIPTION: These criteria include: Patient being treated for a respiratory tract infection, urinary tract infection, cellulitis or clostridium difficile associated diarrhea if on metronidazole The patient is not neutropenic and does not exhibit a GI malabsorption state The patient is eating (either orally or via tube) and/or has been taking other orally administered medications for a least 24 hours The patient is improving clinically and has a Tmax < 100.5  If you have questions about this conversion, please contact the Pharmacy Department   Tressie Ellis 12/27/22

## 2022-12-27 NOTE — Progress Notes (Addendum)
PROGRESS NOTE    DEVLEN STURKIE  ZOX:[id number] DOB: 14-Apr-1930 DOA: 12/24/2022 PCP: Marina Goodell, MD   Assessment & Plan:   Principal Problem:   Sepsis [facility]) Active Problems:   Essential hypertension   Gastroesophageal reflux disease without esophagitis   Primary osteoarthritis of left knee   Primary osteoarthritis of right knee   Pure hypercholesterolemia   Type 2 diabetes mellitus with stage 3 chronic kidney disease, without long-term current use of insulin (HCC)   AMS (altered mental status)   Elevated serum creatinine   Candida rash of groin  Assessment and Plan: Sepsis: met criteria w/ leukocytosis, tachypnea, hypothermia but unknown source, possibly UTI. Continue on rocephin, azithromycin. Procal 2.37. Concern for aspiration pneumonia w/ dysphagia   Unlikely bacteremia: blood cxs growing staph, likely containment.  Repeat blood cxs NGTD  Dementia: oriented to self & place only. Continue w/ supportive care. Palliative care consulted  Dysphagia: likely secondary to dementia. Strict NPO as per speech but continue on dysphagia I diet for comfort only.   Hematuria: likely secondary to pt pulling on cath tubing. H&H are trending down slightly. No need for a transfusion currently    Candida rash of groin: continue on nystatin powder   Acute encephalopathy: etiology unclear, infection vs delirium vs worsening dementia. Re-orient prn. Continue w/ supportive   DM2: likely well controlled. Continue on SSI w/ accuchecks   Likely AKI: baseline Cr/GFR is unknown. Cr is trending down from day prior. Avoid nephrotoxic meds   Urinary retention: foley placed in ER 12/25/22. Urine cx ordered secondary c/o dysuria & meeting criteria for sepsis. Urine cx shows no growth but abxs were given prior to urine being collected for urine cx    HLD: continue on statin   HTN: no longer taking hydrochlorothiazide,enalapril. IV hydralazine prn        DVT prophylaxis: heparin  Code  Status: DNR  Family Communication: discussed pt's care w/ pt's son, Devyne, and answered his questions  Disposition Plan: unclear.  Pt's MPOA wants to talk w/ palliative care on 12/29/22 before making any decisions on d/c  Level of care: Med-Surg Status is: Inpatient Remains inpatient appropriate because: severity of illness, requiring IV abxs.    Consultants:  Palliative care ID   Procedures:   Antimicrobials: rocephin, azithromycin    Subjective: Pt pleasantly confused  Objective: Vitals:   12/26/22 1700 12/26/22 1933 12/26/22 2046 12/27/22 0512  BP:  (!) 116/51 (!) 125/55 130/71  Pulse: 94 86 85 66  Resp: 20 20 20 16   Temp:  99.9 F (37.7 C) 99.1 F (37.3 C) 98.5 F (36.9 C)  TempSrc:   Oral Oral  SpO2:  96% 94% 98%  Weight:   64.7 kg   Height:   5\' 7"  (1.702 m)     Intake/Output Summary (Last 24 hours) at 12/27/2022 0758 Last data filed at 12/26/2022 2000 Gross per 24 hour  Intake 0 ml  Output 250 ml  Net -250 ml   Filed Weights   12/24/22 0900 12/26/22 0423 12/26/22 2046  Weight: 68 kg 64.6 kg 64.7 kg    Examination:  General exam: Appears calm and comfortable  Respiratory system: diminished breath sounds b/l  Cardiovascular system: S1 & S2+. No rubs or clicks Gastrointestinal system: abd is soft, NT, ND & hypoactive bowel sounds Central nervous system: alert & oriented to self  Psychiatry: Judgement and insight appears close to baseline.flat mood and affect    Data Reviewed: I have personally reviewed following labs  and imaging studies  CBC: Recent Labs  Lab 12/24/22 0857 12/25/22 0453 12/26/22 0501 12/26/22 1909 12/27/22 0415  WBC 21.1* 10.6* 11.4*  --  10.0  NEUTROABS 18.7*  --   --   --   --   HGB 14.5 13.9 13.6 13.5 12.5*  HCT 43.7 42.0 41.3 39.5 36.6*  MCV 96.0 96.3 96.5  --  93.6  PLT 214 164 162  --  145*   Basic Metabolic Panel: Recent Labs  Lab 12/24/22 0857 12/25/22 0453 12/26/22 0501 12/27/22 0415  NA 138 138 141 139  K  4.7 3.8 3.9 3.3*  CL 102 105 106 103  CO2 24 23 24 27   GLUCOSE 111* 88 67* 86  BUN 35* 24* 24* 30*  CREATININE 1.60* 1.08 1.14 1.08  CALCIUM 9.4 8.6* 9.0 8.8*   GFR: Estimated Creatinine Clearance: 40.8 mL/min (by C-G formula based on SCr of 1.08 mg/dL). Liver Function Tests: No results for input(s): "AST", "ALT", "ALKPHOS", "BILITOT", "PROT", "ALBUMIN" in the last 168 hours. No results for input(s): "LIPASE", "AMYLASE" in the last 168 hours. No results for input(s): "AMMONIA" in the last 168 hours. Coagulation Profile: No results for input(s): "INR", "PROTIME" in the last 168 hours. Cardiac Enzymes: Recent Labs  Lab 12/24/22 0857  CKTOTAL 854*   BNP (last 3 results) No results for input(s): "PROBNP" in the last 8760 hours. HbA1C: Recent Labs    12/25/22 0453  HGBA1C 6.2*   CBG: Recent Labs  Lab 12/25/22 1723 12/25/22 2103 12/26/22 0833 12/26/22 1146 12/26/22 2005  GLUCAP 82 78 70 102* 95   Lipid Profile: No results for input(s): "CHOL", "HDL", "LDLCALC", "TRIG", "CHOLHDL", "LDLDIRECT" in the last 72 hours. Thyroid Function Tests: No results for input(s): "TSH", "T4TOTAL", "FREET4", "T3FREE", "THYROIDAB" in the last 72 hours. Anemia Panel: No results for input(s): "VITAMINB12", "FOLATE", "FERRITIN", "TIBC", "IRON", "RETICCTPCT" in the last 72 hours. Sepsis Labs: Recent Labs  Lab 12/24/22 0857 12/24/22 1138 12/25/22 0453  PROCALCITON 0.69  --  2.37  LATICACIDVEN  --  1.1  --     Recent Results (from the past 240 hour(s))  SARS Coronavirus 2 by RT PCR (hospital order, performed in [facility] hospital lab) *cepheid single result test* Anterior Nasal Swab     Status: None   Collection Time: 12/24/22  2:06 PM   Specimen: Anterior Nasal Swab  Result Value Ref Range Status   SARS Coronavirus 2 by RT PCR NEGATIVE NEGATIVE Final    Comment: (NOTE) SARS-CoV-2 target nucleic acids are NOT DETECTED.  The SARS-CoV-2 RNA is generally detectable in upper and  lower respiratory specimens during the acute phase of infection. The lowest concentration of SARS-CoV-2 viral copies this assay can detect is 250 copies / mL. A negative result does not preclude SARS-CoV-2 infection and should not be used as the sole basis for treatment or other patient management decisions.  A negative result may occur with improper specimen collection / handling, submission of specimen other than nasopharyngeal swab, presence of viral mutation(s) within the areas targeted by this assay, and inadequate number of viral copies (<250 copies / mL). A negative result must be combined with clinical observations, patient history, and epidemiological information.  Fact Sheet for Patients:   RoadLapTop.co.za  Fact Sheet for Healthcare Providers: http://kim-miller.com/  This test is not yet approved or  cleared by the [place] FDA and has been authorized for detection and/or diagnosis of SARS-CoV-2 by FDA under an Emergency Use Authorization (EUA).  This EUA will  remain in effect (meaning this test can be used) for the duration of the COVID-19 declaration under Section 564(b)(1) of the Act, 21 U.S.C. section 360bbb-3(b)(1), unless the authorization is terminated or revoked sooner.  Performed at [facility], [address] [place], [place] [address]   Blood culture (routine x 2)     Status: None (Preliminary result)   Collection Time: 12/24/22  2:06 PM   Specimen: BLOOD  Result Value Ref Range Status   Specimen Description BLOOD BLOOD LEFT FOREARM  Final   Special Requests   Final    BOTTLES DRAWN AEROBIC AND ANAEROBIC Blood Culture results may not be optimal due to an inadequate volume of blood received in culture bottles   Culture   Final    NO GROWTH 3 DAYS Performed at [facility], [address] [place], [place] [address]    Report Status PENDING  Incomplete  Blood culture (routine x 2)      Status: Abnormal (Preliminary result)   Collection Time: 12/24/22  2:07 PM   Specimen: BLOOD  Result Value Ref Range Status   Specimen Description   Final    BLOOD BLOOD RIGHT FOREARM Performed at [facility], [address], [place], [place] [address]    Special Requests   Final    BOTTLES DRAWN AEROBIC AND ANAEROBIC Blood Culture results may not be optimal due to an inadequate volume of blood received in culture bottles Performed at [facility], [address] [place], [place] [address]    Culture  Setup Time   Final    GRAM POSITIVE COCCI IN BOTH AEROBIC AND ANAEROBIC BOTTLES Organism ID to follow CRITICAL RESULT CALLED TO, READ BACK BY AND VERIFIED WITH: [email or web address]  12/25/22 RH Performed at [facility], [address] [place], [place] [address]    Culture (A)  Final    STAPHYLOCOCCUS HOMINIS THE SIGNIFICANCE OF ISOLATING THIS ORGANISM FROM A SINGLE SET OF BLOOD CULTURES WHEN MULTIPLE SETS ARE DRAWN IS UNCERTAIN. PLEASE NOTIFY THE MICROBIOLOGY DEPARTMENT WITHIN ONE WEEK IF SPECIATION AND SENSITIVITIES ARE REQUIRED. Performed at [facility], [address], [place], [place] [address]    Report Status PENDING  Incomplete  Blood Culture ID Panel (Reflexed)     Status: Abnormal   Collection Time: 12/24/22  2:07 PM  Result Value Ref Range Status   Enterococcus faecalis NOT DETECTED NOT DETECTED Final   Enterococcus Faecium NOT DETECTED NOT DETECTED Final   Listeria monocytogenes NOT DETECTED NOT DETECTED Final   Staphylococcus species DETECTED (A) NOT DETECTED Final    Comment: CRITICAL RESULT CALLED TO, READ BACK BY AND VERIFIED WITH: Harrold Donath [id number] 12/25/22 RH    Staphylococcus aureus (BCID) NOT DETECTED NOT DETECTED Final   Staphylococcus epidermidis NOT DETECTED NOT DETECTED Final   Staphylococcus lugdunensis NOT DETECTED NOT DETECTED Final   Streptococcus species NOT DETECTED NOT DETECTED Final   Streptococcus agalactiae NOT  DETECTED NOT DETECTED Final   Streptococcus pneumoniae NOT DETECTED NOT DETECTED Final   Streptococcus pyogenes NOT DETECTED NOT DETECTED Final   A.calcoaceticus-baumannii NOT DETECTED NOT DETECTED Final   Bacteroides fragilis NOT DETECTED NOT DETECTED Final   Enterobacterales NOT DETECTED NOT DETECTED Final   Enterobacter cloacae complex NOT DETECTED NOT DETECTED Final   Escherichia coli NOT DETECTED NOT DETECTED Final   Klebsiella aerogenes NOT DETECTED NOT DETECTED Final   Klebsiella oxytoca NOT DETECTED NOT DETECTED Final   Klebsiella pneumoniae NOT DETECTED NOT DETECTED Final   Proteus  species NOT DETECTED NOT DETECTED Final   Salmonella species NOT DETECTED NOT DETECTED Final   Serratia marcescens NOT DETECTED NOT DETECTED Final   Haemophilus influenzae NOT DETECTED NOT DETECTED Final   Neisseria meningitidis NOT DETECTED NOT DETECTED Final   Pseudomonas aeruginosa NOT DETECTED NOT DETECTED Final   Stenotrophomonas maltophilia NOT DETECTED NOT DETECTED Final   Candida albicans NOT DETECTED NOT DETECTED Final   Candida auris NOT DETECTED NOT DETECTED Final   Candida glabrata NOT DETECTED NOT DETECTED Final   Candida krusei NOT DETECTED NOT DETECTED Final   Candida parapsilosis NOT DETECTED NOT DETECTED Final   Candida tropicalis NOT DETECTED NOT DETECTED Final   Cryptococcus neoformans/gattii NOT DETECTED NOT DETECTED Final    Comment: Performed at [facility], [address] [place], [place] [address]  Culture, blood (Routine X 2) w Reflex to ID Panel     Status: None (Preliminary result)   Collection Time: 12/25/22  9:19 AM   Specimen: BLOOD LEFT HAND  Result Value Ref Range Status   Specimen Description BLOOD LEFT HAND  Final   Special Requests   Final    BOTTLES DRAWN AEROBIC AND ANAEROBIC Blood Culture adequate volume   Culture   Final    NO GROWTH 2 DAYS Performed at [facility], [address], [place], [place] [address]    Report Status PENDING   Incomplete  Culture, blood (Routine X 2) w Reflex to ID Panel     Status: None (Preliminary result)   Collection Time: 12/25/22  9:27 AM   Specimen: BLOOD LEFT HAND  Result Value Ref Range Status   Specimen Description BLOOD LEFT HAND  Final   Special Requests   Final    BOTTLES DRAWN AEROBIC AND ANAEROBIC Blood Culture adequate volume   Culture   Final    NO GROWTH 2 DAYS Performed at [facility], [address], [place], [place] [address]    Report Status PENDING  Incomplete  Urine Culture (for pregnant, neutropenic or urologic patients or patients with an indwelling urinary catheter)     Status: None   Collection Time: 12/25/22 10:29 AM   Specimen: In/Out Cath Urine  Result Value Ref Range Status   Specimen Description   Final    IN/OUT CATH URINE Performed at [facility], [address], [place], [place] [address]    Special Requests   Final    NONE Performed at [facility], [address] [place], [place] [address]    Culture   Final    NO GROWTH Performed at [facility], [address] [place], [place] [address]    Report Status 12/26/2022 FINAL  Final         Radiology Studies: DG Swallowing Func-Speech Pathology  Result Date: 12/26/2022 Table formatting from the original result was not included. Modified Barium Swallow Study Patient Details Name: Adam [name] MRN: [id number] Date of Birth: 21-Mar-1930 Today's Date: 12/26/2022 HPI/PMH: HPI: Pt is a 89 year old male with history of Dementia, hyperlipidemia, hypertension, GERD, who presents to the emergency department for chief concerns of altered mental status.  Per ED documentation, patient was found [place] the woods wandering by his neighbor.     Vitals in the ED showed temperature of 95.4, improved to 97.7 with bear hugger, respiration rate 21, heart rate of 86, blood pressure 150/93, SpO2 97% on room air. Serum sodium is 138, potassium 4.7, chloride 102.  CXR: No acute  cardiopulmonary disease.  2. Chronic calcified pleural plaques. Clinical Impression: Clinical Impression: Pt presents with profound oropharyngeal dysphagia that appears to be sensorimotor in nature resulting in silent and sensed aspiration of thin liquids and nectar thick liquids when consuming via spoon. Pt's oral phase is c/b decreased bolus cohesion, weak AP tranfer with premature passive spillage of boluses into pharynx and widespread oral residue post swallow. Pt's pharyngeal response is significantly weak with inability to achieve airway protection. Gross amounts of sensed and silent aspiration are observed during and following swallow initiation. In addition to sensed and silent aspiration, pt observed with significant pharyngeal residue that he is not able to clear despite numerous cued swallows. At this time, a safe PO diet cannot be recommended.  Given the nature of pt's oropharyngela impairment, a feeding tube is not likely to reduce pt's risk of aspirating his own salvia. Recommend proactive oral care to reduce bacterial load of salvia and consult to Palliative Care to determine GOC. Factors that may increase risk of adverse event in presence of aspiration Rubye Oaks & Clearance Coots 2021): Factors that may increase risk of adverse event in presence of aspiration Rubye Oaks & Clearance Coots 2021): Reduced cognitive function; Frail or deconditioned; Dependence for feeding and/or oral hygiene; Weak cough; Frequent aspiration of large volumes Recommendations/Plan: Swallowing Evaluation Recommendations Swallowing Evaluation Recommendations Recommendations: NPO Medication Administration: Via alternative means Oral care recommendations: Oral care QID (4x/day) Recommended consults: Consider Palliative care Caregiver Recommendations: -- (TBD) Treatment Plan Treatment Plan Treatment recommendations: No treatment recommended at this time Follow-up recommendations: No SLP follow up Recommendations Comment: consult of Palliative Care  Functional status assessment: Patient has had a recent decline in their functional status and/or demonstrates limited ability to make significant improvements in function in a reasonable and predictable amount of time. Recommendations Recommendations for follow up therapy are one component of a multi-disciplinary discharge planning process, led by the attending physician.  Recommendations may be updated based on patient status, additional functional criteria and insurance authorization. Assessment: Orofacial Exam: Orofacial Exam Oral Cavity: Oral Hygiene: WFL Oral Cavity - Dentition: Edentulous (has full set of dentures - not available at this time) Orofacial Anatomy: WFL Oral Motor/Sensory Function: Generalized oral weakness Anatomy: Anatomy: WFL Boluses Administered: Boluses Administered Boluses Administered: Thin liquids (Level 0); Mildly thick liquids (Level 2, nectar thick)  Oral Impairment Domain: Oral Impairment Domain Lip Closure: No labial escape Tongue control during bolus hold: Not tested (pt unable to achieve) Bolus transport/lingual motion: Repetitive/disorganized tongue motion Oral residue: Residue collection on oral structures; Majority of bolus remaining Location of oral residue : Lateral sulci; Floor of mouth; Tongue Initiation of pharyngeal swallow : Pyriform sinuses  Pharyngeal Impairment Domain: Pharyngeal Impairment Domain Soft palate elevation: No bolus between soft palate (SP)/pharyngeal wall (PW) Laryngeal elevation: Minimal superior movement of thyroid cartilage with minimal approximation of arytenoids to epiglottic petiole; No superior movement of thyroid cartilage Anterior hyoid excursion: Partial anterior movement; No anterior movement Epiglottic movement: No inversion Laryngeal vestibule closure: None, wide column air/contrast in laryngeal vestibule; Incomplete, narrow column air/contrast in laryngeal vestibule Pharyngeal stripping wave : Present - diminished Pharyngoesophageal segment  opening: Complete distension and complete duration, no obstruction of flow Tongue base retraction: Trace column of contrast or air between tongue base and PPW; Narrow column of contrast or air between tongue base and PPW Pharyngeal residue: Majority of contrast within or on pharyngeal structures; Minimal to no pharyngeal clearance Location of pharyngeal residue: Valleculae; Pharyngeal wall  Esophageal Impairment Domain: Esophageal Impairment Domain Esophageal clearance upright position: Complete clearance,  esophageal coating Pill: No data recorded Penetration/Aspiration Scale Score: Penetration/Aspiration Scale Score 4.  Material enters airway, CONTACTS cords then ejected out: Thin liquids (Level 0); Mildly thick liquids (Level 2, nectar thick) 5.  Material enters airway, CONTACTS cords and not ejected out: Thin liquids (Level 0); Mildly thick liquids (Level 2, nectar thick) 6.  Material enters airway, passes BELOW cords then ejected out: Thin liquids (Level 0); Mildly thick liquids (Level 2, nectar thick) 7.  Material enters airway, passes BELOW cords and not ejected out despite cough attempt by patient: Thin liquids (Level 0); Mildly thick liquids (Level 2, nectar thick) 8.  Material enters airway, passes BELOW cords without attempt by patient to eject out (silent aspiration) : Thin liquids (Level 0); Mildly thick liquids (Level 2, nectar thick) Compensatory Strategies: No data recorded  General Information: Caregiver present: No  Diet Prior to this Study: NPO   Temperature : Normal   Respiratory Status: WFL   Supplemental O2: None (Room air)   History of Recent Intubation: No  Behavior/Cognition: Alert; Cooperative; Pleasant mood; Confused; Distractible; Requires cueing (baseline dementia, he is able to follow instructions for multiple swallows) Self-Feeding Abilities: Able to self-feed; Needs assist with self-feeding Baseline vocal quality/speech: Normal Volitional Cough: Unable to elicit Volitional Swallow:  Unable to elicit Exam Limitations: No limitations Goal Planning: Prognosis for improved oropharyngeal function: Guarded Barriers to Reach Goals: Cognitive deficits; Severity of deficits; Overall medical prognosis Barriers/Prognosis Comment: profound sensed and silent aspiration, dementia, GERD Patient/Family Stated Goal: none stated Consulted and agree with results and recommendations: Pt unable/family or caregiver not available; Physician; Nurse Pain: Pain Assessment Pain Assessment: Faces Faces Pain Scale: 0 End of Session: Start Time:SLP Start Time (ACUTE ONLY): 0755 Stop Time: SLP Stop Time (ACUTE ONLY): 0810 Time Calculation:SLP Time Calculation (min) (ACUTE ONLY): 15 min Charges: SLP Evaluations $ SLP Speech Visit: 1 Visit SLP Evaluations $BSS Swallow: 1 Procedure $MBS Swallow: 1 Procedure SLP visit diagnosis: SLP Visit Diagnosis: Dysphagia, oropharyngeal phase (R13.12); Dysphagia, pharyngoesophageal phase (R13.14) Past Medical History: Past Medical History: Diagnosis Date  Acute prostatitis   BPH (benign prostatic hypertrophy)   Dysphagia   Elevated PSA   Esophageal reflux   Frequency   HTN (hypertension)   Nocturia   Osteoarthritis   Unilateral inguinal hernia   Urgency of micturation  Past Surgical History: Past Surgical History: Procedure Laterality Date  CATARACT EXTRACTION Bilateral   HERNIA REPAIR    bilateral inguinal  LEG SURGERY    leg trauma as a child Happi Overton 12/26/2022, 8:43 AM       Scheduled Meds:  Chlorhexidine Gluconate Cloth  6 each Topical Daily   heparin  5,000 Units Subcutaneous Q8H   influenza vaccine adjuvanted  0.5 mL Intramuscular Tomorrow-1000   insulin aspart  0-5 Units Subcutaneous QHS   insulin aspart  0-9 Units Subcutaneous TID WC   nystatin   Topical TID   pantoprazole (PROTONIX) IV  40 mg Intravenous Q24H   scopolamine  1 patch Transdermal Q72H   Continuous Infusions:  azithromycin 500 mg (12/26/22 2142)   cefTRIAXone (ROCEPHIN)  IV 1 g (12/26/22 0839)      LOS: 3 days       Charise Killian, MD [facility] Pager [phone]  If 7PM-7AM, please contact night-coverage [email or web address] 12/27/2022, 7:58 AM

## 2022-12-28 ENCOUNTER — Inpatient Hospital Stay: Payer: HMO

## 2022-12-28 DIAGNOSIS — Z515 Encounter for palliative care: Secondary | ICD-10-CM

## 2022-12-28 DIAGNOSIS — F03918 Unspecified dementia, unspecified severity, with other behavioral disturbance: Secondary | ICD-10-CM | POA: Diagnosis not present

## 2022-12-28 DIAGNOSIS — A419 Sepsis, unspecified organism: Secondary | ICD-10-CM | POA: Diagnosis not present

## 2022-12-28 LAB — BASIC METABOLIC PANEL
Anion gap: 12 (ref 5–15)
BUN: 32 mg/dL — ABNORMAL HIGH (ref 8–23)
CO2: 26 mmol/L (ref 22–32)
Calcium: 9.1 mg/dL (ref 8.9–10.3)
Chloride: 101 mmol/L (ref 98–111)
Creatinine, Ser: 1.01 mg/dL (ref 0.61–1.24)
GFR, Estimated: >60 mL/min (ref >60)
Glucose, Bld: 110 mg/dL — ABNORMAL HIGH (ref 70–99)
Potassium: 3.4 mmol/L — ABNORMAL LOW (ref 3.5–5.1)
Sodium: 139 mmol/L (ref 135–145)

## 2022-12-28 LAB — CBC
HCT: 41.2 % (ref 39.0–52.0)
Hemoglobin: 14 g/dL (ref 13.0–17.0)
MCH: 31.8 pg (ref 26.0–34.0)
MCHC: 34 g/dL (ref 30.0–36.0)
MCV: 93.6 fL (ref 80.0–100.0)
Platelets: 161 10*3/uL (ref 150–400)
RBC: 4.4 MIL/uL (ref 4.22–5.81)
RDW: 12.6 % (ref 11.5–15.5)
WBC: 9 10*3/uL (ref 4.0–10.5)
nRBC: 0 % (ref 0.0–0.2)

## 2022-12-28 LAB — GLUCOSE, CAPILLARY
Glucose-Capillary: 109 mg/dL — ABNORMAL HIGH (ref 70–99)
Glucose-Capillary: 112 mg/dL — ABNORMAL HIGH (ref 70–99)
Glucose-Capillary: 134 mg/dL — ABNORMAL HIGH (ref 70–99)
Glucose-Capillary: 95 mg/dL (ref 70–99)
Glucose-Capillary: 98 mg/dL (ref 70–99)
Glucose-Capillary: 98 mg/dL (ref 70–99)

## 2022-12-28 MED ORDER — MORPHINE SULFATE (PF) 2 MG/ML IV SOLN
1.0000 mg | INTRAVENOUS | Status: DC | PRN
  Administered 2022-12-28 – 2023-01-06 (×7): 1 mg via INTRAVENOUS
  Filled 2022-12-28 (×7): qty 1

## 2022-12-28 MED ORDER — POTASSIUM CHLORIDE 10 MEQ/100ML IV SOLN
10.0000 meq | INTRAVENOUS | Status: AC
  Administered 2022-12-28 (×2): 10 meq via INTRAVENOUS
  Filled 2022-12-28 (×2): qty 100

## 2022-12-28 NOTE — Plan of Care (Signed)
  Problem: Fluid Volume: Goal: Hemodynamic stability will improve Outcome: Progressing   Problem: Clinical Measurements: Goal: Diagnostic test results will improve Outcome: Progressing Goal: Signs and symptoms of infection will decrease Outcome: Progressing   Problem: Respiratory: Goal: Ability to maintain adequate ventilation will improve Outcome: Progressing   Problem: Education: Goal: Ability to describe self-care measures that may prevent or decrease complications (Diabetes Survival Skills Education) will improve Outcome: Progressing Goal: Individualized Educational Video(s) Outcome: Progressing   Problem: Coping: Goal: Ability to adjust to condition or change in health will improve Outcome: Progressing   Problem: Fluid Volume: Goal: Ability to maintain a balanced intake and output will improve Outcome: Progressing   Problem: Health Behavior/Discharge Planning: Goal: Ability to identify and utilize available resources and services will improve Outcome: Progressing Goal: Ability to manage health-related needs will improve Outcome: Progressing   Problem: Metabolic: Goal: Ability to maintain appropriate glucose levels will improve Outcome: Progressing   Problem: Nutritional: Goal: Maintenance of adequate nutrition will improve Outcome: Progressing   Problem: Skin Integrity: Goal: Risk for impaired skin integrity will decrease Outcome: Progressing   Problem: Tissue Perfusion: Goal: Adequacy of tissue perfusion will improve Outcome: Progressing   Problem: Education: Goal: Knowledge of General Education information will improve Description: Including pain rating scale, medication(s)/side effects and non-pharmacologic comfort measures Outcome: Progressing   Problem: Health Behavior/Discharge Planning: Goal: Ability to manage health-related needs will improve Outcome: Progressing   Problem: Clinical Measurements: Goal: Ability to maintain clinical measurements  within normal limits will improve Outcome: Progressing Goal: Will remain free from infection Outcome: Progressing Goal: Diagnostic test results will improve Outcome: Progressing Goal: Respiratory complications will improve Outcome: Progressing Goal: Cardiovascular complication will be avoided Outcome: Progressing   Problem: Activity: Goal: Risk for activity intolerance will decrease Outcome: Progressing   Problem: Nutrition: Goal: Adequate nutrition will be maintained Outcome: Progressing   Problem: Coping: Goal: Level of anxiety will decrease Outcome: Progressing   Problem: Elimination: Goal: Will not experience complications related to bowel motility Outcome: Progressing Goal: Will not experience complications related to urinary retention Outcome: Progressing   Problem: Pain Managment: Goal: General experience of comfort will improve Outcome: Progressing   Problem: Safety: Goal: Ability to remain free from injury will improve Outcome: Progressing   Problem: Skin Integrity: Goal: Risk for impaired skin integrity will decrease Outcome: Progressing

## 2022-12-28 NOTE — Progress Notes (Signed)
Palliative Medicine:  Follow-up visit.  I met with patient's son.  He again verbalized understanding of patient's frailty and risk of decline.  He is in agreement with current scope of treatment and would like to explore options for facility placement.  He feels patient is better today and would like to give patient time to see if he continues to improve. We discussed the differences between palliative care and hospice.  If patient discharges to an ALF -could consider hospice involvement.  If patient discharges to SNF for rehab -I would recommend palliative care to follow.  Time Total: 20 minutes  Visit consisted of counseling and education dealing with the complex and emotionally intense issues of symptom management and palliative care in the setting of serious and potentially life-threatening illness.Greater than 50%  of this time was spent counseling and coordinating care related to the above assessment and plan.  Signed by: Laurette Schimke, PhD, NP-C

## 2022-12-28 NOTE — Progress Notes (Signed)
PROGRESS NOTE    Adam [name]  WJX:[id number] DOB: 1930-02-04 DOA: 12/24/2022 PCP: Marina Goodell, MD   Assessment & Plan:   Principal Problem:   Sepsis [facility]) Active Problems:   Essential hypertension   Gastroesophageal reflux disease without esophagitis   Primary osteoarthritis of left knee   Primary osteoarthritis of right knee   Pure hypercholesterolemia   Type 2 diabetes mellitus with stage 3 chronic kidney disease, without long-term current use of insulin (HCC)   AMS (altered mental status)   Elevated serum creatinine   Candida rash of groin  Assessment and Plan: Sepsis: met criteria w/ leukocytosis, tachypnea, hypothermia but unknown source, possibly UTI. Continue on rocephin, azithromycin. Procal 2.37. Concern w/ aspiration pneumonia w/ dysphagia   Unlikely bacteremia: blood cxs growing staph, likely containment.  Repeat blood cxs NGTD  Dementia: oriented to self & place only. Continue w/ supportive care. Palliative care following and recs apprec   Dysphagia: likely secondary to dementia. Strict NPO as per speech but continue on dysphagia I diet for comfort only.   Hypokalemia: potassium given   Hematuria: likely secondary to pt pulling on cath tubing. H&H are WNL   Candida rash of groin: continue w/ nystatin powder   Acute encephalopathy: etiology unclear, infection vs delirium vs worsening dementia. Re-orient prn. Continue w/ supportive care   DM2: likely well controlled. Continue on SSI w/ accuchecks   Likely AKI: baseline Cr/GFR is unknown.  Cr is labile. Avoid nephrotoxic meds   Urinary retention: foley placed in ER 12/25/22. Urine cx ordered secondary c/o dysuria & meeting criteria for sepsis. Urine cx shows no growth but abxs were given prior to urine being collected for urine cx    HLD: continue on statin   HTN: no longer taking hydrochlorothiazide,enalapril. IV hydralazine prn        DVT prophylaxis: heparin  Code Status: DNR  Family  Communication: discussed pt's care w/ pt's son, Quatavius, and answered his questions  Disposition Plan: unclear.  Pt's MPOA wants to talk w/ palliative care on 12/29/22 before making any decisions on d/c  Level of care: Med-Surg Status is: Inpatient Remains inpatient appropriate because: severity of illness, requiring IV abxs.    Consultants:  Palliative care ID   Procedures:   Antimicrobials: rocephin, azithromycin    Subjective: Pt c/o sore throat   Objective: Vitals:   12/27/22 0758 12/27/22 1657 12/27/22 2024 12/28/22 0404  BP: (!) 122/53 (!) 147/88 (!) 161/83 (!) 155/79  Pulse: 69 80 86 86  Resp: 16 16 20 18   Temp: 97.6 F (36.4 C) (!) 97.5 F (36.4 C) 97.7 F (36.5 C) 98.6 F (37 C)  TempSrc:    Oral  SpO2: 100% 97% 98% 99%  Weight:      Height:        Intake/Output Summary (Last 24 hours) at 12/28/2022 0809 Last data filed at 12/28/2022 0300 Gross per 24 hour  Intake 0 ml  Output 2500 ml  Net -2500 ml   Filed Weights   12/24/22 0900 12/26/22 0423 12/26/22 2046  Weight: 68 kg 64.6 kg 64.7 kg    Examination:  General exam: appears comfortable  Respiratory system: decreased breath sounds b/l Cardiovascular system: S1/S2+. No rubs or clicks Gastrointestinal system: abd is soft, NT, ND & hypoactive bowel sounds Central nervous system: alert & oriented to self  Psychiatry: judgement and insight appears close to baseline. Flat mood and affect    Data Reviewed: I have personally reviewed following labs and imaging  studies  CBC: Recent Labs  Lab 12/24/22 0857 12/25/22 0453 12/26/22 0501 12/26/22 1909 12/27/22 0415 12/28/22 0420  WBC 21.1* 10.6* 11.4*  --  10.0 9.0  NEUTROABS 18.7*  --   --   --   --   --   HGB 14.5 13.9 13.6 13.5 12.5* 14.0  HCT 43.7 42.0 41.3 39.5 36.6* 41.2  MCV 96.0 96.3 96.5  --  93.6 93.6  PLT 214 164 162  --  145* 161   Basic Metabolic Panel: Recent Labs  Lab 12/24/22 0857 12/25/22 0453 12/26/22 0501 12/27/22 0415  12/28/22 0420  NA 138 138 141 139 139  K 4.7 3.8 3.9 3.3* 3.4*  CL 102 105 106 103 101  CO2 24 23 24 27 26   GLUCOSE 111* 88 67* 86 110*  BUN 35* 24* 24* 30* 32*  CREATININE 1.60* 1.08 1.14 1.08 1.01  CALCIUM 9.4 8.6* 9.0 8.8* 9.1   GFR: Estimated Creatinine Clearance: 43.6 mL/min (by C-G formula based on SCr of 1.01 mg/dL). Liver Function Tests: No results for input(s): "AST", "ALT", "ALKPHOS", "BILITOT", "PROT", "ALBUMIN" in the last 168 hours. No results for input(s): "LIPASE", "AMYLASE" in the last 168 hours. No results for input(s): "AMMONIA" in the last 168 hours. Coagulation Profile: No results for input(s): "INR", "PROTIME" in the last 168 hours. Cardiac Enzymes: Recent Labs  Lab 12/24/22 0857  CKTOTAL 854*   BNP (last 3 results) No results for input(s): "PROBNP" in the last 8760 hours. HbA1C: No results for input(s): "HGBA1C" in the last 72 hours.  CBG: Recent Labs  Lab 12/26/22 2005 12/27/22 0802 12/27/22 1209 12/27/22 1658 12/27/22 2028  GLUCAP 95 80 73 83 79   Lipid Profile: No results for input(s): "CHOL", "HDL", "LDLCALC", "TRIG", "CHOLHDL", "LDLDIRECT" in the last 72 hours. Thyroid Function Tests: No results for input(s): "TSH", "T4TOTAL", "FREET4", "T3FREE", "THYROIDAB" in the last 72 hours. Anemia Panel: No results for input(s): "VITAMINB12", "FOLATE", "FERRITIN", "TIBC", "IRON", "RETICCTPCT" in the last 72 hours. Sepsis Labs: Recent Labs  Lab 12/24/22 0857 12/24/22 1138 12/25/22 0453  PROCALCITON 0.69  --  2.37  LATICACIDVEN  --  1.1  --     Recent Results (from the past 240 hour(s))  SARS Coronavirus 2 by RT PCR (hospital order, performed in [facility] hospital lab) *cepheid single result test* Anterior Nasal Swab     Status: None   Collection Time: 12/24/22  2:06 PM   Specimen: Anterior Nasal Swab  Result Value Ref Range Status   SARS Coronavirus 2 by RT PCR NEGATIVE NEGATIVE Final    Comment: (NOTE) SARS-CoV-2 target nucleic acids  are NOT DETECTED.  The SARS-CoV-2 RNA is generally detectable in upper and lower respiratory specimens during the acute phase of infection. The lowest concentration of SARS-CoV-2 viral copies this assay can detect is 250 copies / mL. A negative result does not preclude SARS-CoV-2 infection and should not be used as the sole basis for treatment or other patient management decisions.  A negative result may occur with improper specimen collection / handling, submission of specimen other than nasopharyngeal swab, presence of viral mutation(s) within the areas targeted by this assay, and inadequate number of viral copies (<250 copies / mL). A negative result must be combined with clinical observations, patient history, and epidemiological information.  Fact Sheet for Patients:   RoadLapTop.co.za  Fact Sheet for Healthcare Providers: http://kim-miller.com/  This test is not yet approved or  cleared by the [place] FDA and has been authorized for  detection and/or diagnosis of SARS-CoV-2 by FDA under an Emergency Use Authorization (EUA).  This EUA will remain in effect (meaning this test can be used) for the duration of the COVID-19 declaration under Section 564(b)(1) of the Act, 21 U.S.C. section 360bbb-3(b)(1), unless the authorization is terminated or revoked sooner.  Performed at [facility], [address] [place], [place] [address]   Blood culture (routine x 2)     Status: None (Preliminary result)   Collection Time: 12/24/22  2:06 PM   Specimen: BLOOD  Result Value Ref Range Status   Specimen Description BLOOD BLOOD LEFT FOREARM  Final   Special Requests   Final    BOTTLES DRAWN AEROBIC AND ANAEROBIC Blood Culture results may not be optimal due to an inadequate volume of blood received in culture bottles   Culture   Final    NO GROWTH 4 DAYS Performed at [facility], [address] [place], [place]  [address]    Report Status PENDING  Incomplete  Blood culture (routine x 2)     Status: Abnormal   Collection Time: 12/24/22  2:07 PM   Specimen: BLOOD  Result Value Ref Range Status   Specimen Description   Final    BLOOD BLOOD RIGHT FOREARM Performed at [facility], [address] [place], [place] [address]    Special Requests   Final    BOTTLES DRAWN AEROBIC AND ANAEROBIC Blood Culture results may not be optimal due to an inadequate volume of blood received in culture bottles Performed at [facility], [address], [place], [place] [address]    Culture  Setup Time   Final    GRAM POSITIVE COCCI IN BOTH AEROBIC AND ANAEROBIC BOTTLES Organism ID to follow CRITICAL RESULT CALLED TO, READ BACK BY AND VERIFIED WITH: [email or web address]  12/25/22 RH Performed at [facility], [address] [place], [place] [address]    Culture (A)  Final    STAPHYLOCOCCUS HOMINIS THE SIGNIFICANCE OF ISOLATING THIS ORGANISM FROM A SINGLE SET OF BLOOD CULTURES WHEN MULTIPLE SETS ARE DRAWN IS UNCERTAIN. PLEASE NOTIFY THE MICROBIOLOGY DEPARTMENT WITHIN ONE WEEK IF SPECIATION AND SENSITIVITIES ARE REQUIRED. Performed at [facility], [address] [place], [place] [address]    Report Status 12/27/2022 FINAL  Final  Blood Culture ID Panel (Reflexed)     Status: Abnormal   Collection Time: 12/24/22  2:07 PM  Result Value Ref Range Status   Enterococcus faecalis NOT DETECTED NOT DETECTED Final   Enterococcus Faecium NOT DETECTED NOT DETECTED Final   Listeria monocytogenes NOT DETECTED NOT DETECTED Final   Staphylococcus species DETECTED (A) NOT DETECTED Final    Comment: CRITICAL RESULT CALLED TO, READ BACK BY AND VERIFIED WITH: Harrold Donath [id number] 12/25/22 RH    Staphylococcus aureus (BCID) NOT DETECTED NOT DETECTED Final   Staphylococcus epidermidis NOT DETECTED NOT DETECTED Final   Staphylococcus lugdunensis NOT DETECTED NOT DETECTED Final   Streptococcus species NOT  DETECTED NOT DETECTED Final   Streptococcus agalactiae NOT DETECTED NOT DETECTED Final   Streptococcus pneumoniae NOT DETECTED NOT DETECTED Final   Streptococcus pyogenes NOT DETECTED NOT DETECTED Final   A.calcoaceticus-baumannii NOT DETECTED NOT DETECTED Final   Bacteroides fragilis NOT DETECTED NOT DETECTED Final   Enterobacterales NOT DETECTED NOT DETECTED Final   Enterobacter cloacae complex NOT DETECTED NOT DETECTED Final   Escherichia coli NOT DETECTED NOT DETECTED Final   Klebsiella aerogenes NOT DETECTED NOT DETECTED Final   Klebsiella oxytoca NOT  DETECTED NOT DETECTED Final   Klebsiella pneumoniae NOT DETECTED NOT DETECTED Final   Proteus species NOT DETECTED NOT DETECTED Final   Salmonella species NOT DETECTED NOT DETECTED Final   Serratia marcescens NOT DETECTED NOT DETECTED Final   Haemophilus influenzae NOT DETECTED NOT DETECTED Final   Neisseria meningitidis NOT DETECTED NOT DETECTED Final   Pseudomonas aeruginosa NOT DETECTED NOT DETECTED Final   Stenotrophomonas maltophilia NOT DETECTED NOT DETECTED Final   Candida albicans NOT DETECTED NOT DETECTED Final   Candida auris NOT DETECTED NOT DETECTED Final   Candida glabrata NOT DETECTED NOT DETECTED Final   Candida krusei NOT DETECTED NOT DETECTED Final   Candida parapsilosis NOT DETECTED NOT DETECTED Final   Candida tropicalis NOT DETECTED NOT DETECTED Final   Cryptococcus neoformans/gattii NOT DETECTED NOT DETECTED Final    Comment: Performed at [facility], [address] [place], [place] [address]  Culture, blood (Routine X 2) w Reflex to ID Panel     Status: None (Preliminary result)   Collection Time: 12/25/22  9:19 AM   Specimen: BLOOD LEFT HAND  Result Value Ref Range Status   Specimen Description BLOOD LEFT HAND  Final   Special Requests   Final    BOTTLES DRAWN AEROBIC AND ANAEROBIC Blood Culture adequate volume   Culture   Final    NO GROWTH 3 DAYS Performed at [facility], [address] [place], [place] [address]    Report Status PENDING  Incomplete  Culture, blood (Routine X 2) w Reflex to ID Panel     Status: None (Preliminary result)   Collection Time: 12/25/22  9:27 AM   Specimen: BLOOD LEFT HAND  Result Value Ref Range Status   Specimen Description BLOOD LEFT HAND  Final   Special Requests   Final    BOTTLES DRAWN AEROBIC AND ANAEROBIC Blood Culture adequate volume   Culture   Final    NO GROWTH 3 DAYS Performed at [facility], [address], [place], [place] [address]    Report Status PENDING  Incomplete  Urine Culture (for pregnant, neutropenic or urologic patients or patients with an indwelling urinary catheter)     Status: None   Collection Time: 12/25/22 10:29 AM   Specimen: In/Out Cath Urine  Result Value Ref Range Status   Specimen Description   Final    IN/OUT CATH URINE Performed at [facility], [address], [place], [place] [address]    Special Requests   Final    NONE Performed at [facility], [address] [place], [place] [address]    Culture   Final    NO GROWTH Performed at [facility], [address], [place], [place] [address]    Report Status 12/26/2022 FINAL  Final         Radiology Studies: DG Swallowing Func-Speech Pathology  Result Date: 12/26/2022 Table formatting from the original result was not included. Modified Barium Swallow Study Patient Details Name: KEALOHA KURTZMAN MRN: [id number] Date of Birth: 1929/08/15 Today's Date: 12/26/2022 HPI/PMH: HPI: Pt is a 89 year old male with history of Dementia, hyperlipidemia, hypertension, GERD, who presents to the emergency department for chief concerns of altered mental status.  Per ED documentation, patient was found [place] the woods wandering by his neighbor.     Vitals in the ED showed temperature of 95.4, improved to 97.7 with bear hugger, respiration rate 21, heart rate of 86, blood pressure 150/93, SpO2 97% on room air.  Serum  sodium is 138, potassium 4.7, chloride 102.  CXR: No acute cardiopulmonary disease.  2. Chronic calcified pleural plaques. Clinical Impression: Clinical Impression: Pt presents with profound oropharyngeal dysphagia that appears to be sensorimotor in nature resulting in silent and sensed aspiration of thin liquids and nectar thick liquids when consuming via spoon. Pt's oral phase is c/b decreased bolus cohesion, weak AP tranfer with premature passive spillage of boluses into pharynx and widespread oral residue post swallow. Pt's pharyngeal response is significantly weak with inability to achieve airway protection. Gross amounts of sensed and silent aspiration are observed during and following swallow initiation. In addition to sensed and silent aspiration, pt observed with significant pharyngeal residue that he is not able to clear despite numerous cued swallows. At this time, a safe PO diet cannot be recommended.  Given the nature of pt's oropharyngela impairment, a feeding tube is not likely to reduce pt's risk of aspirating his own salvia. Recommend proactive oral care to reduce bacterial load of salvia and consult to Palliative Care to determine GOC. Factors that may increase risk of adverse event in presence of aspiration Rubye Oaks & Clearance Coots 2021): Factors that may increase risk of adverse event in presence of aspiration Rubye Oaks & Clearance Coots 2021): Reduced cognitive function; Frail or deconditioned; Dependence for feeding and/or oral hygiene; Weak cough; Frequent aspiration of large volumes Recommendations/Plan: Swallowing Evaluation Recommendations Swallowing Evaluation Recommendations Recommendations: NPO Medication Administration: Via alternative means Oral care recommendations: Oral care QID (4x/day) Recommended consults: Consider Palliative care Caregiver Recommendations: -- (TBD) Treatment Plan Treatment Plan Treatment recommendations: No treatment recommended at this time Follow-up recommendations: No SLP  follow up Recommendations Comment: consult of Palliative Care Functional status assessment: Patient has had a recent decline in their functional status and/or demonstrates limited ability to make significant improvements in function in a reasonable and predictable amount of time. Recommendations Recommendations for follow up therapy are one component of a multi-disciplinary discharge planning process, led by the attending physician.  Recommendations may be updated based on patient status, additional functional criteria and insurance authorization. Assessment: Orofacial Exam: Orofacial Exam Oral Cavity: Oral Hygiene: WFL Oral Cavity - Dentition: Edentulous (has full set of dentures - not available at this time) Orofacial Anatomy: WFL Oral Motor/Sensory Function: Generalized oral weakness Anatomy: Anatomy: WFL Boluses Administered: Boluses Administered Boluses Administered: Thin liquids (Level 0); Mildly thick liquids (Level 2, nectar thick)  Oral Impairment Domain: Oral Impairment Domain Lip Closure: No labial escape Tongue control during bolus hold: Not tested (pt unable to achieve) Bolus transport/lingual motion: Repetitive/disorganized tongue motion Oral residue: Residue collection on oral structures; Majority of bolus remaining Location of oral residue : Lateral sulci; Floor of mouth; Tongue Initiation of pharyngeal swallow : Pyriform sinuses  Pharyngeal Impairment Domain: Pharyngeal Impairment Domain Soft palate elevation: No bolus between soft palate (SP)/pharyngeal wall (PW) Laryngeal elevation: Minimal superior movement of thyroid cartilage with minimal approximation of arytenoids to epiglottic petiole; No superior movement of thyroid cartilage Anterior hyoid excursion: Partial anterior movement; No anterior movement Epiglottic movement: No inversion Laryngeal vestibule closure: None, wide column air/contrast in laryngeal vestibule; Incomplete, narrow column air/contrast in laryngeal vestibule Pharyngeal  stripping wave : Present - diminished Pharyngoesophageal segment opening: Complete distension and complete duration, no obstruction of flow Tongue base retraction: Trace column of contrast or air between tongue base and PPW; Narrow column of contrast or air between tongue base and PPW Pharyngeal residue: Majority of contrast within or on pharyngeal structures; Minimal to no pharyngeal clearance Location of pharyngeal residue:  Valleculae; Pharyngeal wall  Esophageal Impairment Domain: Esophageal Impairment Domain Esophageal clearance upright position: Complete clearance, esophageal coating Pill: No data recorded Penetration/Aspiration Scale Score: Penetration/Aspiration Scale Score 4.  Material enters airway, CONTACTS cords then ejected out: Thin liquids (Level 0); Mildly thick liquids (Level 2, nectar thick) 5.  Material enters airway, CONTACTS cords and not ejected out: Thin liquids (Level 0); Mildly thick liquids (Level 2, nectar thick) 6.  Material enters airway, passes BELOW cords then ejected out: Thin liquids (Level 0); Mildly thick liquids (Level 2, nectar thick) 7.  Material enters airway, passes BELOW cords and not ejected out despite cough attempt by patient: Thin liquids (Level 0); Mildly thick liquids (Level 2, nectar thick) 8.  Material enters airway, passes BELOW cords without attempt by patient to eject out (silent aspiration) : Thin liquids (Level 0); Mildly thick liquids (Level 2, nectar thick) Compensatory Strategies: No data recorded  General Information: Caregiver present: No  Diet Prior to this Study: NPO   Temperature : Normal   Respiratory Status: WFL   Supplemental O2: None (Room air)   History of Recent Intubation: No  Behavior/Cognition: Alert; Cooperative; Pleasant mood; Confused; Distractible; Requires cueing (baseline dementia, he is able to follow instructions for multiple swallows) Self-Feeding Abilities: Able to self-feed; Needs assist with self-feeding Baseline vocal quality/speech:  Normal Volitional Cough: Unable to elicit Volitional Swallow: Unable to elicit Exam Limitations: No limitations Goal Planning: Prognosis for improved oropharyngeal function: Guarded Barriers to Reach Goals: Cognitive deficits; Severity of deficits; Overall medical prognosis Barriers/Prognosis Comment: profound sensed and silent aspiration, dementia, GERD Patient/Family Stated Goal: none stated Consulted and agree with results and recommendations: Pt unable/family or caregiver not available; Physician; Nurse Pain: Pain Assessment Pain Assessment: Faces Faces Pain Scale: 0 End of Session: Start Time:SLP Start Time (ACUTE ONLY): 0755 Stop Time: SLP Stop Time (ACUTE ONLY): 0810 Time Calculation:SLP Time Calculation (min) (ACUTE ONLY): 15 min Charges: SLP Evaluations $ SLP Speech Visit: 1 Visit SLP Evaluations $BSS Swallow: 1 Procedure $MBS Swallow: 1 Procedure SLP visit diagnosis: SLP Visit Diagnosis: Dysphagia, oropharyngeal phase (R13.12); Dysphagia, pharyngoesophageal phase (R13.14) Past Medical History: Past Medical History: Diagnosis Date  Acute prostatitis   BPH (benign prostatic hypertrophy)   Dysphagia   Elevated PSA   Esophageal reflux   Frequency   HTN (hypertension)   Nocturia   Osteoarthritis   Unilateral inguinal hernia   Urgency of micturation  Past Surgical History: Past Surgical History: Procedure Laterality Date  CATARACT EXTRACTION Bilateral   HERNIA REPAIR    bilateral inguinal  LEG SURGERY    leg trauma as a child Happi Overton 12/26/2022, 8:43 AM       Scheduled Meds:  Chlorhexidine Gluconate Cloth  6 each Topical Daily   heparin  5,000 Units Subcutaneous Q8H   influenza vaccine adjuvanted  0.5 mL Intramuscular Tomorrow-1000   insulin aspart  0-5 Units Subcutaneous QHS   insulin aspart  0-9 Units Subcutaneous TID WC   nystatin   Topical TID   pantoprazole (PROTONIX) IV  40 mg Intravenous Q24H   scopolamine  1 patch Transdermal Q72H   Continuous Infusions:  azithromycin 500 mg  (12/27/22 2141)   cefTRIAXone (ROCEPHIN)  IV Stopped (12/27/22 1001)   potassium chloride       LOS: 4 days       Charise Killian, MD [facility] Pager [phone]  If 7PM-7AM, please contact night-coverage [email or web address] 12/28/2022, 8:09 AM

## 2022-12-28 NOTE — Consult Note (Signed)
Palliative Medicine  Name: Adam [name] Date: 12/28/2022 MRN: [id number]  DOB: 05-22-1929  Patient Care Team: Marina Goodell, MD as PCP - General (Family Medicine)    REASON FOR CONSULTATION: Adam [name] is a 89 y.o. male with multiple medical problems including dementia, hyperlipidemia, hypertension, GERD, he was admitted to the hospital 9//24 with altered mental status found to have sepsis with UTI.  Palliative care was consulted to address goals and manage ongoing symptoms.  SOCIAL HISTORY:     reports that he quit smoking about 28 years ago. He has never used smokeless tobacco. He reports that he does not drink alcohol and does not use drugs.  Patient is a widower.  He lives at home but had hired caregivers during the day.  He has a son and 2 daughters who are involved in his care.  Patient retired from working at [employer].  ADVANCE DIRECTIVES:  Not on file  CODE STATUS: DNR  PAST MEDICAL HISTORY: Past Medical History:  Diagnosis Date   Acute prostatitis    BPH (benign prostatic hypertrophy)    Dysphagia    Elevated PSA    Esophageal reflux    Frequency    HTN (hypertension)    Nocturia    Osteoarthritis    Unilateral inguinal hernia    Urgency of micturation     PAST SURGICAL HISTORY:  Past Surgical History:  Procedure Laterality Date   CATARACT EXTRACTION Bilateral    HERNIA REPAIR     bilateral inguinal   LEG SURGERY     leg trauma as a child    HEMATOLOGY/ONCOLOGY HISTORY:  Oncology History   No history exists.    ALLERGIES:  is allergic to penicillin v potassium.  MEDICATIONS:  Current Facility-Administered Medications  Medication Dose Route Frequency Provider Last Rate Last Admin   acetaminophen (TYLENOL) tablet 650 mg  650 mg Oral Q6H PRN Cox, Amy N, DO       Or   acetaminophen (TYLENOL) suppository 650 mg  650 mg Rectal Q6H PRN Cox, Amy N, DO       azithromycin (ZITHROMAX) 500 mg in sodium chloride 0.9 % 250 mL  IVPB  500 mg Intravenous QHS Dorothea Ogle B, RPH 250 mL/hr at 12/27/22 2141 500 mg at 12/27/22 2141   cefTRIAXone (ROCEPHIN) 1 g in sodium chloride 0.9 % 100 mL IVPB  1 g Intravenous Q24H Charise Killian, MD   Stopped at 12/27/22 1001   Chlorhexidine Gluconate Cloth 2 % PADS 6 each  6 each Topical Daily Charise Killian, MD   6 each at 12/27/22 1000   glycopyrrolate (ROBINUL) injection 0.2 mg  0.2 mg Intravenous QID PRN Charise Killian, MD   0.2 mg at 12/27/22 1334   haloperidol lactate (HALDOL) injection 1-2 mg  1-2 mg Intramuscular Q6H PRN Mansy, Jan A, MD   1 mg at 12/25/22 2332   heparin injection 5,000 Units  5,000 Units Subcutaneous Q8H Cox, Amy N, DO   5,000 Units at 12/28/22 4098   hydrALAZINE (APRESOLINE) injection 5 mg  5 mg Intravenous Q6H PRN Cox, Amy N, DO       influenza vaccine adjuvanted (FLUAD) injection 0.5 mL  0.5 mL Intramuscular Tomorrow-1000 Charise Killian, MD       insulin aspart (novoLOG) injection 0-5 Units  0-5 Units Subcutaneous QHS Cox, Amy N, DO       insulin aspart (novoLOG) injection 0-9 Units  0-9 Units Subcutaneous TID WC  Palliative Medicine  Name: Adam [name] Date: 12/28/2022 MRN: [id number]  DOB: 05-22-1929  Patient Care Team: Marina Goodell, MD as PCP - General (Family Medicine)    REASON FOR CONSULTATION: Adam [name] is a 89 y.o. male with multiple medical problems including dementia, hyperlipidemia, hypertension, GERD, he was admitted to the hospital 9//24 with altered mental status found to have sepsis with UTI.  Palliative care was consulted to address goals and manage ongoing symptoms.  SOCIAL HISTORY:     reports that he quit smoking about 28 years ago. He has never used smokeless tobacco. He reports that he does not drink alcohol and does not use drugs.  Patient is a widower.  He lives at home but had hired caregivers during the day.  He has a son and 2 daughters who are involved in his care.  Patient retired from working at [employer].  ADVANCE DIRECTIVES:  Not on file  CODE STATUS: DNR  PAST MEDICAL HISTORY: Past Medical History:  Diagnosis Date   Acute prostatitis    BPH (benign prostatic hypertrophy)    Dysphagia    Elevated PSA    Esophageal reflux    Frequency    HTN (hypertension)    Nocturia    Osteoarthritis    Unilateral inguinal hernia    Urgency of micturation     PAST SURGICAL HISTORY:  Past Surgical History:  Procedure Laterality Date   CATARACT EXTRACTION Bilateral    HERNIA REPAIR     bilateral inguinal   LEG SURGERY     leg trauma as a child    HEMATOLOGY/ONCOLOGY HISTORY:  Oncology History   No history exists.    ALLERGIES:  is allergic to penicillin v potassium.  MEDICATIONS:  Current Facility-Administered Medications  Medication Dose Route Frequency Provider Last Rate Last Admin   acetaminophen (TYLENOL) tablet 650 mg  650 mg Oral Q6H PRN Cox, Amy N, DO       Or   acetaminophen (TYLENOL) suppository 650 mg  650 mg Rectal Q6H PRN Cox, Amy N, DO       azithromycin (ZITHROMAX) 500 mg in sodium chloride 0.9 % 250 mL  IVPB  500 mg Intravenous QHS Dorothea Ogle B, RPH 250 mL/hr at 12/27/22 2141 500 mg at 12/27/22 2141   cefTRIAXone (ROCEPHIN) 1 g in sodium chloride 0.9 % 100 mL IVPB  1 g Intravenous Q24H Charise Killian, MD   Stopped at 12/27/22 1001   Chlorhexidine Gluconate Cloth 2 % PADS 6 each  6 each Topical Daily Charise Killian, MD   6 each at 12/27/22 1000   glycopyrrolate (ROBINUL) injection 0.2 mg  0.2 mg Intravenous QID PRN Charise Killian, MD   0.2 mg at 12/27/22 1334   haloperidol lactate (HALDOL) injection 1-2 mg  1-2 mg Intramuscular Q6H PRN Mansy, Jan A, MD   1 mg at 12/25/22 2332   heparin injection 5,000 Units  5,000 Units Subcutaneous Q8H Cox, Amy N, DO   5,000 Units at 12/28/22 4098   hydrALAZINE (APRESOLINE) injection 5 mg  5 mg Intravenous Q6H PRN Cox, Amy N, DO       influenza vaccine adjuvanted (FLUAD) injection 0.5 mL  0.5 mL Intramuscular Tomorrow-1000 Charise Killian, MD       insulin aspart (novoLOG) injection 0-5 Units  0-5 Units Subcutaneous QHS Cox, Amy N, DO       insulin aspart (novoLOG) injection 0-9 Units  0-9 Units Subcutaneous TID WC  Christen Butter M.D.   On: 12/28/2022 10:42   DG Swallowing Func-Speech Pathology  Result Date: 12/26/2022 Table formatting from the original result was not included. Modified Barium Swallow Study Patient Details Name: SEARLE CASTORINA MRN: [id number] Date of Birth: [birth year] Today's Date: 12/26/2022 HPI/PMH: HPI: Pt is a 89 year old male with history of Dementia, hyperlipidemia, hypertension, GERD, who presents to the emergency department for chief concerns of altered mental status.  Per ED documentation, patient was found by the woods wandering by his neighbor.     Vitals in the ED showed temperature of 95.4, improved to 97.7 with bear hugger, respiration rate 21, heart rate of 86, blood pressure 150/93, SpO2 97% on room air. Serum sodium is 138, potassium 4.7, chloride 102.  CXR: No acute cardiopulmonary disease.  2. Chronic calcified pleural plaques. Clinical Impression: Clinical Impression: Pt presents with profound oropharyngeal dysphagia that appears to be sensorimotor in nature resulting in silent and sensed aspiration of thin liquids and nectar thick liquids when consuming via spoon. Pt's oral phase is c/b decreased bolus cohesion, weak AP tranfer with premature passive spillage of boluses into pharynx and widespread oral residue post swallow. Pt's pharyngeal response is significantly weak with inability to achieve airway protection. Gross amounts of sensed and silent aspiration are observed during and following swallow initiation. In addition to sensed and silent aspiration, pt observed with significant pharyngeal residue that he is not able to clear despite numerous cued swallows. At this time, a safe PO diet cannot be recommended.  Given the nature of pt's oropharyngela impairment, a feeding tube is not likely to reduce pt's risk of aspirating his own  salvia. Recommend proactive oral care to reduce bacterial load of salvia and consult to Palliative Care to determine GOC. Factors that may increase risk of adverse event in presence of aspiration Rubye Oaks & Clearance Coots 2021): Factors that may increase risk of adverse event in presence of aspiration Rubye Oaks & Clearance Coots 2021): Reduced cognitive function; Frail or deconditioned; Dependence for feeding and/or oral hygiene; Weak cough; Frequent aspiration of large volumes Recommendations/Plan: Swallowing Evaluation Recommendations Swallowing Evaluation Recommendations Recommendations: NPO Medication Administration: Via alternative means Oral care recommendations: Oral care QID (4x/day) Recommended consults: Consider Palliative care Caregiver Recommendations: -- (TBD) Treatment Plan Treatment Plan Treatment recommendations: No treatment recommended at this time Follow-up recommendations: No SLP follow up Recommendations Comment: consult of Palliative Care Functional status assessment: Patient has had a recent decline in their functional status and/or demonstrates limited ability to make significant improvements in function in a reasonable and predictable amount of time. Recommendations Recommendations for follow up therapy are one component of a multi-disciplinary discharge planning process, led by the attending physician.  Recommendations may be updated based on patient status, additional functional criteria and insurance authorization. Assessment: Orofacial Exam: Orofacial Exam Oral Cavity: Oral Hygiene: WFL Oral Cavity - Dentition: Edentulous (has full set of dentures - not available at this time) Orofacial Anatomy: WFL Oral Motor/Sensory Function: Generalized oral weakness Anatomy: Anatomy: WFL Boluses Administered: Boluses Administered Boluses Administered: Thin liquids (Level 0); Mildly thick liquids (Level 2, nectar thick)  Oral Impairment Domain: Oral Impairment Domain Lip Closure: No labial escape Tongue control during  bolus hold: Not tested (pt unable to achieve) Bolus transport/lingual motion: Repetitive/disorganized tongue motion Oral residue: Residue collection on oral structures; Majority of bolus remaining Location of oral residue : Lateral sulci; Floor of mouth; Tongue Initiation of pharyngeal swallow : Pyriform sinuses  Pharyngeal Impairment Domain: Pharyngeal Impairment Domain Soft palate elevation: No bolus between soft  Palliative Medicine  Name: Adam [name] Date: 12/28/2022 MRN: [id number]  DOB: 05-22-1929  Patient Care Team: Marina Goodell, MD as PCP - General (Family Medicine)    REASON FOR CONSULTATION: Adam [name] is a 89 y.o. male with multiple medical problems including dementia, hyperlipidemia, hypertension, GERD, he was admitted to the hospital 9//24 with altered mental status found to have sepsis with UTI.  Palliative care was consulted to address goals and manage ongoing symptoms.  SOCIAL HISTORY:     reports that he quit smoking about 28 years ago. He has never used smokeless tobacco. He reports that he does not drink alcohol and does not use drugs.  Patient is a widower.  He lives at home but had hired caregivers during the day.  He has a son and 2 daughters who are involved in his care.  Patient retired from working at [employer].  ADVANCE DIRECTIVES:  Not on file  CODE STATUS: DNR  PAST MEDICAL HISTORY: Past Medical History:  Diagnosis Date   Acute prostatitis    BPH (benign prostatic hypertrophy)    Dysphagia    Elevated PSA    Esophageal reflux    Frequency    HTN (hypertension)    Nocturia    Osteoarthritis    Unilateral inguinal hernia    Urgency of micturation     PAST SURGICAL HISTORY:  Past Surgical History:  Procedure Laterality Date   CATARACT EXTRACTION Bilateral    HERNIA REPAIR     bilateral inguinal   LEG SURGERY     leg trauma as a child    HEMATOLOGY/ONCOLOGY HISTORY:  Oncology History   No history exists.    ALLERGIES:  is allergic to penicillin v potassium.  MEDICATIONS:  Current Facility-Administered Medications  Medication Dose Route Frequency Provider Last Rate Last Admin   acetaminophen (TYLENOL) tablet 650 mg  650 mg Oral Q6H PRN Cox, Amy N, DO       Or   acetaminophen (TYLENOL) suppository 650 mg  650 mg Rectal Q6H PRN Cox, Amy N, DO       azithromycin (ZITHROMAX) 500 mg in sodium chloride 0.9 % 250 mL  IVPB  500 mg Intravenous QHS Dorothea Ogle B, RPH 250 mL/hr at 12/27/22 2141 500 mg at 12/27/22 2141   cefTRIAXone (ROCEPHIN) 1 g in sodium chloride 0.9 % 100 mL IVPB  1 g Intravenous Q24H Charise Killian, MD   Stopped at 12/27/22 1001   Chlorhexidine Gluconate Cloth 2 % PADS 6 each  6 each Topical Daily Charise Killian, MD   6 each at 12/27/22 1000   glycopyrrolate (ROBINUL) injection 0.2 mg  0.2 mg Intravenous QID PRN Charise Killian, MD   0.2 mg at 12/27/22 1334   haloperidol lactate (HALDOL) injection 1-2 mg  1-2 mg Intramuscular Q6H PRN Mansy, Jan A, MD   1 mg at 12/25/22 2332   heparin injection 5,000 Units  5,000 Units Subcutaneous Q8H Cox, Amy N, DO   5,000 Units at 12/28/22 4098   hydrALAZINE (APRESOLINE) injection 5 mg  5 mg Intravenous Q6H PRN Cox, Amy N, DO       influenza vaccine adjuvanted (FLUAD) injection 0.5 mL  0.5 mL Intramuscular Tomorrow-1000 Charise Killian, MD       insulin aspart (novoLOG) injection 0-5 Units  0-5 Units Subcutaneous QHS Cox, Amy N, DO       insulin aspart (novoLOG) injection 0-9 Units  0-9 Units Subcutaneous TID WC  Palliative Medicine  Name: Adam [name] Date: 12/28/2022 MRN: [id number]  DOB: 05-22-1929  Patient Care Team: Marina Goodell, MD as PCP - General (Family Medicine)    REASON FOR CONSULTATION: Adam [name] is a 89 y.o. male with multiple medical problems including dementia, hyperlipidemia, hypertension, GERD, he was admitted to the hospital 9//24 with altered mental status found to have sepsis with UTI.  Palliative care was consulted to address goals and manage ongoing symptoms.  SOCIAL HISTORY:     reports that he quit smoking about 28 years ago. He has never used smokeless tobacco. He reports that he does not drink alcohol and does not use drugs.  Patient is a widower.  He lives at home but had hired caregivers during the day.  He has a son and 2 daughters who are involved in his care.  Patient retired from working at [employer].  ADVANCE DIRECTIVES:  Not on file  CODE STATUS: DNR  PAST MEDICAL HISTORY: Past Medical History:  Diagnosis Date   Acute prostatitis    BPH (benign prostatic hypertrophy)    Dysphagia    Elevated PSA    Esophageal reflux    Frequency    HTN (hypertension)    Nocturia    Osteoarthritis    Unilateral inguinal hernia    Urgency of micturation     PAST SURGICAL HISTORY:  Past Surgical History:  Procedure Laterality Date   CATARACT EXTRACTION Bilateral    HERNIA REPAIR     bilateral inguinal   LEG SURGERY     leg trauma as a child    HEMATOLOGY/ONCOLOGY HISTORY:  Oncology History   No history exists.    ALLERGIES:  is allergic to penicillin v potassium.  MEDICATIONS:  Current Facility-Administered Medications  Medication Dose Route Frequency Provider Last Rate Last Admin   acetaminophen (TYLENOL) tablet 650 mg  650 mg Oral Q6H PRN Cox, Amy N, DO       Or   acetaminophen (TYLENOL) suppository 650 mg  650 mg Rectal Q6H PRN Cox, Amy N, DO       azithromycin (ZITHROMAX) 500 mg in sodium chloride 0.9 % 250 mL  IVPB  500 mg Intravenous QHS Dorothea Ogle B, RPH 250 mL/hr at 12/27/22 2141 500 mg at 12/27/22 2141   cefTRIAXone (ROCEPHIN) 1 g in sodium chloride 0.9 % 100 mL IVPB  1 g Intravenous Q24H Charise Killian, MD   Stopped at 12/27/22 1001   Chlorhexidine Gluconate Cloth 2 % PADS 6 each  6 each Topical Daily Charise Killian, MD   6 each at 12/27/22 1000   glycopyrrolate (ROBINUL) injection 0.2 mg  0.2 mg Intravenous QID PRN Charise Killian, MD   0.2 mg at 12/27/22 1334   haloperidol lactate (HALDOL) injection 1-2 mg  1-2 mg Intramuscular Q6H PRN Mansy, Jan A, MD   1 mg at 12/25/22 2332   heparin injection 5,000 Units  5,000 Units Subcutaneous Q8H Cox, Amy N, DO   5,000 Units at 12/28/22 4098   hydrALAZINE (APRESOLINE) injection 5 mg  5 mg Intravenous Q6H PRN Cox, Amy N, DO       influenza vaccine adjuvanted (FLUAD) injection 0.5 mL  0.5 mL Intramuscular Tomorrow-1000 Charise Killian, MD       insulin aspart (novoLOG) injection 0-5 Units  0-5 Units Subcutaneous QHS Cox, Amy N, DO       insulin aspart (novoLOG) injection 0-9 Units  0-9 Units Subcutaneous TID WC

## 2022-12-29 DIAGNOSIS — A419 Sepsis, unspecified organism: Secondary | ICD-10-CM | POA: Diagnosis not present

## 2022-12-29 DIAGNOSIS — F03918 Unspecified dementia, unspecified severity, with other behavioral disturbance: Secondary | ICD-10-CM | POA: Diagnosis not present

## 2022-12-29 LAB — GLUCOSE, CAPILLARY
Glucose-Capillary: 98 mg/dL (ref 70–99)
Glucose-Capillary: 179 mg/dL — ABNORMAL HIGH (ref 70–99)
Glucose-Capillary: 197 mg/dL — ABNORMAL HIGH (ref 70–99)
Glucose-Capillary: 121 mg/dL — ABNORMAL HIGH (ref 70–99)

## 2022-12-29 LAB — BASIC METABOLIC PANEL
Anion gap: 8 (ref 5–15)
BUN: 29 mg/dL — ABNORMAL HIGH (ref 8–23)
CO2: 27 mmol/L (ref 22–32)
Calcium: 8.9 mg/dL (ref 8.9–10.3)
Chloride: 103 mmol/L (ref 98–111)
Creatinine, Ser: 0.89 mg/dL (ref 0.61–1.24)
GFR, Estimated: >60 mL/min (ref >60)
Glucose, Bld: 88 mg/dL (ref 70–99)
Potassium: 4.1 mmol/L (ref 3.5–5.1)
Sodium: 138 mmol/L (ref 135–145)

## 2022-12-29 LAB — CBC
HCT: 41.3 % (ref 39.0–52.0)
Hemoglobin: 13.7 g/dL (ref 13.0–17.0)
MCH: 31.6 pg (ref 26.0–34.0)
MCHC: 33.2 g/dL (ref 30.0–36.0)
MCV: 95.4 fL (ref 80.0–100.0)
Platelets: 144 10*3/uL — ABNORMAL LOW (ref 150–400)
RBC: 4.33 MIL/uL (ref 4.22–5.81)
RDW: 12.6 % (ref 11.5–15.5)
WBC: 8 10*3/uL (ref 4.0–10.5)
nRBC: 0 % (ref 0.0–0.2)

## 2022-12-29 LAB — CULTURE, BLOOD (ROUTINE X 2): Culture: NO GROWTH

## 2022-12-29 NOTE — TOC Progression Note (Signed)
Transition of Care [facility], [place]) - Progression Note    Patient Details  Name: Adam [name] MRN: [id number] Date of Birth: 04/07/1930  Transition of Care [facility]) CM/SW Contact  Allena Katz, LCSW Phone Number: 12/29/2022, 10:17 AM  Clinical Narrative:   CSW spoke with pt son who reports he would now like rehab in [place]. Sons first preference is [facility]. Referrals resent with [facility] reflecting need for SNF. Tiffany contacted with [facility].     Expected Discharge Plan: Skilled Nursing Facility Barriers to Discharge: Continued Medical Work up  Expected Discharge Plan and Services       Living arrangements for the past 2 months: Single Family Home                                       Social Determinants of Health (SDOH) Interventions SDOH Screenings   Food Insecurity: Patient Unable To Answer (12/25/2022)  Housing: Patient Unable To Answer (12/25/2022)  Transportation Needs: Patient Unable To Answer (12/25/2022)  Utilities: Patient Unable To Answer (12/25/2022)  Financial Resource Strain: Low Risk  (10/20/2022)   Received from [facility]  Tobacco Use: Medium Risk (12/24/2022)    Readmission Risk Interventions     No data to display

## 2022-12-29 NOTE — Evaluation (Signed)
Physical Therapy Evaluation Patient Details Name: Adam [name] MRN: [id number] DOB: [birth year] Today's Date: 12/29/2022  History of Present Illness  [name] is a 89 year old male with history of dementia, hyperlipidemia, hypertension, GERD, who presents to the emergency department for chief concerns of altered mental status after found wandering outside of house by neighbour. Admitted for sepsis   Clinical Impression  Patient received up in recliner with son, Nadine Counts at bedside. Patient is agreeable to PT assessment. He requires mod/max +2 assist to stand from recliner. 1 trial without AD, one with RW. He demonstrates posterior leaning with both attempts. Feet sliding out in front. He is unable to attain balance despite +2 assist. Patient will continue to benefit from skilled PT to improve mobility and safety.            If plan is discharge home, recommend the following: A lot of help with walking and/or transfers;A little help with bathing/dressing/bathroom;Assist for transportation;Direct supervision/assist for financial management;Direct supervision/assist for medications management   Can travel by private vehicle   No    Equipment Recommendations Rolling walker (2 wheels)  Recommendations for Other Services       Functional Status Assessment Patient has had a recent decline in their functional status and demonstrates the ability to make significant improvements in function in a reasonable and predictable amount of time.     Precautions / Restrictions Precautions Precautions: Fall Restrictions Weight Bearing Restrictions: No      Mobility  Bed Mobility               General bed mobility comments: NT, patient in recliner    Transfers Overall transfer level: Needs assistance Equipment used: None, Rolling walker (2 wheels) Transfers: Sit to/from Stand Sit to Stand: +2 physical assistance, Mod assist, Max assist           General transfer comment:  patient performed tow sit to stand transfers from recliner. One without AD and one with walker. Both required +2 mod/Max assist. Poor standing balance with posterior leaning.    Ambulation/Gait               General Gait Details: unable  Stairs            Wheelchair Mobility     Tilt Bed    Modified Rankin (Stroke Patients Only)       Balance Overall balance assessment: Needs assistance Sitting-balance support: Feet supported Sitting balance-Leahy Scale: Fair     Standing balance support: Bilateral upper extremity supported, During functional activity, Reliant on assistive device for balance Standing balance-Leahy Scale: Zero Standing balance comment: unable to balance in standing with +2 assist                             Pertinent Vitals/Pain Pain Assessment Pain Assessment: No/denies pain    Home Living Family/patient expects to be discharged to:: Private residence Living Arrangements: Alone Available Help at Discharge: Personal care attendant;Available PRN/intermittently (PCA for 6 hours a day, he is otherwise by himself at home) Type of Home: House Home Access: Ramped entrance       Home Layout: One level Home Equipment: Cane - single point      Prior Function Prior Level of Function : Needs assist             Mobility Comments: MOD I using cane ADLs Comments: assist for bathing and meals from PCA ~5 hours/day  Extremity/Trunk Assessment   Upper Extremity Assessment Upper Extremity Assessment: Defer to OT evaluation    Lower Extremity Assessment Lower Extremity Assessment: Generalized weakness    Cervical / Trunk Assessment Cervical / Trunk Assessment: Normal  Communication   Communication Communication: No apparent difficulties Cueing Techniques: Verbal cues;Gestural cues;Tactile cues  Cognition Arousal: Alert Behavior During Therapy: WFL for tasks assessed/performed Overall Cognitive Status:  Impaired/Different from baseline Area of Impairment: Following commands, Safety/judgement, Problem solving                       Following Commands: Follows one step commands consistently Safety/Judgement: Decreased awareness of safety, Decreased awareness of deficits   Problem Solving: Slow processing, Difficulty sequencing, Decreased initiation          General Comments      Exercises     Assessment/Plan    PT Assessment Patient needs continued PT services  PT Problem List Decreased strength;Decreased balance;Decreased mobility;Decreased activity tolerance;Decreased coordination;Decreased cognition;Decreased safety awareness;Decreased knowledge of use of DME       PT Treatment Interventions DME instruction;Gait training;Functional mobility training;Therapeutic activities;Therapeutic exercise;Patient/family education;Balance training    PT Goals (Current goals can be found in the Care Plan section)  Acute Rehab PT Goals Patient Stated Goal: to improve mobility PT Goal Formulation: With patient/family Time For Goal Achievement: 01/12/23 Potential to Achieve Goals: Fair    Frequency Min 1X/week     Co-evaluation               AM-PAC PT "6 Clicks" Mobility  Outcome Measure Help needed turning from your back to your side while in a flat bed without using bedrails?: A Lot Help needed moving from lying on your back to sitting on the side of a flat bed without using bedrails?: A Lot Help needed moving to and from a bed to a chair (including a wheelchair)?: A Lot Help needed standing up from a chair using your arms (e.g., wheelchair or bedside chair)?: A Lot Help needed to walk in hospital room?: Total Help needed climbing 3-5 steps with a railing? : Total 6 Click Score: 10    End of Session   Activity Tolerance: Patient limited by fatigue Patient left: in chair;with family/visitor present;with chair alarm set Nurse Communication: Mobility status PT  Visit Diagnosis: Unsteadiness on feet (R26.81);Other abnormalities of gait and mobility (R26.89);Muscle weakness (generalized) (M62.81);Difficulty in walking, not elsewhere classified (R26.2)    Time: 1610-9604 PT Time Calculation (min) (ACUTE ONLY): 10 min   Charges:   PT Evaluation $PT Eval Moderate Complexity: 1 Mod   PT General Charges $$ ACUTE PT VISIT: 1 Visit         Freja Faro, PT, GCS 12/29/22,10:06 AM

## 2022-12-29 NOTE — Care Management Important Message (Signed)
Important Message  Patient Details  Name: Adam [name] MRN: [id number] Date of Birth: 22-Oct-1929   Medicare Important Message Given:  N/A - LOS <3 / Initial given by admissions     Olegario Messier A Ikhlas Albo 12/29/2022, 9:37 AM

## 2022-12-29 NOTE — Plan of Care (Signed)

## 2022-12-29 NOTE — Evaluation (Signed)
Occupational Therapy Evaluation Patient Details Name: Adam [name] MRN: [id number] DOB: [birth year] Today's Date: 12/29/2022   History of Present Illness Adam [name] is a 89 year old male with history of dementia, hyperlipidemia, hypertension, GERD, who presents to the emergency department for chief concerns of altered mental status after found wandering outside of house by neighbour. Admitted for sepsis   Clinical Impression   Adam [name] was seen for OT evaluation this date. Prior to hospital admission, pt was MOD I using SPC for mobliity, assist from PCA for bathing/meals. Pt lives alone. Pt currently requires MOD A exit bed and sit<>stand using RW - difficulty maintaining standing and unable to take steps. Ultimately MAX A bed>chair SPT. SETUP + significantly increased time self-feeding seated in chair. Son in room at end of session to assist with feeding. Pt would benefit from skilled OT to address noted impairments and functional limitations (see below for any additional details). Upon hospital discharge, recommend OT follow up.    If plan is discharge home, recommend the following: Two people to help with walking and/or transfers;Two people to help with bathing/dressing/bathroom;Supervision due to cognitive status    Functional Status Assessment  Patient has had a recent decline in their functional status and demonstrates the ability to make significant improvements in function in a reasonable and predictable amount of time.  Equipment Recommendations  Other (comment) (defer)    Recommendations for Other Services       Precautions / Restrictions Precautions Precautions: Fall Restrictions Weight Bearing Restrictions: No      Mobility Bed Mobility Overal bed mobility: Needs Assistance Bed Mobility: Supine to Sit     Supine to sit: Mod assist, HOB elevated          Transfers Overall transfer level: Needs assistance Equipment used: Rolling walker (2  wheels) Transfers: Sit to/from Stand, Bed to chair/wheelchair/BSC Sit to Stand: Mod assist Stand pivot transfers: Max assist                Balance Overall balance assessment: Needs assistance Sitting-balance support: No upper extremity supported, Feet supported Sitting balance-Leahy Scale: Fair     Standing balance support: Bilateral upper extremity supported Standing balance-Leahy Scale: Poor                             ADL either performed or assessed with clinical judgement   ADL Overall ADL's : Needs assistance/impaired                                       General ADL Comments: SETUP + significantly increased time self-feeding seated in chair. MAX A don B socks seated. MAX A for simulated BSC t/f      Pertinent Vitals/Pain Pain Assessment Pain Assessment: No/denies pain     Extremity/Trunk Assessment Upper Extremity Assessment Upper Extremity Assessment: Right hand dominant;Generalized weakness   Lower Extremity Assessment Lower Extremity Assessment: Generalized weakness       Communication Communication Communication: No apparent difficulties Cueing Techniques: Verbal cues;Tactile cues   Cognition Arousal: Alert Behavior During Therapy: WFL for tasks assessed/performed Overall Cognitive Status: Impaired/Different from baseline Area of Impairment: Following commands, Safety/judgement, Problem solving                       Following Commands: Follows one step commands consistently Safety/Judgement: Decreased awareness of safety, Decreased awareness  of deficits   Problem Solving: Slow processing, Difficulty sequencing, Decreased initiation General Comments: significantly extended time to                Home Living Family/patient expects to be discharged to:: Private residence Living Arrangements: Alone Available Help at Discharge: Personal care attendant;Available PRN/intermittently Type of Home:  House Home Access: Ramped entrance     Home Layout: One level     Bathroom Shower/Tub: Tub/shower unit         Home Equipment: Cane - single point          Prior Functioning/Environment Prior Level of Function : Needs assist             Mobility Comments: MOD I using cane ADLs Comments: assist for bathing and meals from PCA ~5 hours/day        OT Problem List: Decreased strength;Decreased range of motion;Decreased activity tolerance;Impaired balance (sitting and/or standing);Decreased safety awareness      OT Treatment/Interventions: Self-care/ADL training;Therapeutic exercise;Energy conservation;DME and/or AE instruction;Therapeutic activities;Patient/family education;Balance training    OT Goals(Current goals can be found in the care plan section) Acute Rehab OT Goals Patient Stated Goal: to walk OT Goal Formulation: With family Time For Goal Achievement: 01/12/23 Potential to Achieve Goals: Good ADL Goals Pt Will Perform Grooming: standing;with min assist Pt Will Perform Lower Body Dressing: with min assist;sit to/from stand Pt Will Transfer to Toilet: with contact guard assist;stand pivot transfer;bedside commode  OT Frequency: Min 2X/week    Co-evaluation              AM-PAC OT "6 Clicks" Daily Activity     Outcome Measure Help from another person eating meals?: A Little Help from another person taking care of personal grooming?: A Little Help from another person toileting, which includes using toliet, bedpan, or urinal?: A Lot Help from another person bathing (including washing, rinsing, drying)?: A Lot Help from another person to put on and taking off regular upper body clothing?: A Little Help from another person to put on and taking off regular lower body clothing?: A Lot 6 Click Score: 15   End of Session    Activity Tolerance: Patient tolerated treatment well Patient left: in chair;with call bell/phone within reach;with chair alarm  set;with family/visitor present  OT Visit Diagnosis: Other abnormalities of gait and mobility (R26.89);Muscle weakness (generalized) (M62.81)                Time: 4132-4401 OT Time Calculation (min): 29 min Charges:  OT General Charges $OT Visit: 1 Visit OT Evaluation $OT Eval Moderate Complexity: 1 Mod OT Treatments $Self Care/Home Management : 8-22 mins  Kathie Dike, M.S. OTR/L  12/29/22, 9:41 AM  ascom [phone]

## 2022-12-29 NOTE — Progress Notes (Signed)
PROGRESS NOTE    Adam [name]  MWN:[id number] DOB: 28-Mar-1930 DOA: 12/24/2022 PCP: Marina Goodell, MD   Assessment & Plan:   Principal Problem:   Sepsis [facility]) Active Problems:   Essential hypertension   Gastroesophageal reflux disease without esophagitis   Primary osteoarthritis of left knee   Primary osteoarthritis of right knee   Pure hypercholesterolemia   Type 2 diabetes mellitus with stage 3 chronic kidney disease, without long-term current use of insulin (HCC)   AMS (altered mental status)   Elevated serum creatinine   Candida rash of groin   Palliative care encounter  Assessment and Plan: Sepsis: met criteria w/ leukocytosis, tachypnea, hypothermia but unknown source, possibly UTI. Continue on rocephin  x 7 days total. Completed azithromycin course. Procal 2.37. Concern w/ aspiration pneumonia w/ dysphagia. Sepsis resolved  R/o bacteremia: blood cxs growing staph, likely containment. Repeat blood cxs NGTD  Dementia: oriented to self & place only. Continue w/ supportive care. Palliative care recs apprec   Dysphagia: likely secondary to dementia. Strict NPO as per speech but continue on dysphagia I diet for comfort only   Hypokalemia: WNL today   Hematuria: likely secondary to pt pulling on cath tubing. H&H is WNL today   Candida rash of groin: continue on nystatin powder   Acute encephalopathy: etiology unclear, infection vs delirium vs worsening dementia. Re-orient prn. Mental status is back to baseline   DM2: likely well controlled. Continue on SSI w/ accuchecks   Likely AKI: baseline Cr/GFR is unknown. Resolved   Urinary retention: foley placed in ER 12/25/22. Urine cx ordered secondary c/o dysuria & meeting criteria for sepsis. Urine cx shows no growth but abxs were given prior to urine being collected for urine cx    HLD: continue on statin   HTN: no longer taking hydrochlorothiazide,enalapril. IV hydralazine prn        DVT prophylaxis: heparin   Code Status: DNR  Family Communication: discussed pt's care w/ pt's son, Martine, and answered his questions  Disposition Plan: likely d/c to SNF  Level of care: Med-Surg Status is: Inpatient Remains inpatient appropriate because: medically stable. Needs SNF placement     Consultants:  Palliative care ID   Procedures:   Antimicrobials: rocephin   Subjective: Pt c/o fatigue  Objective: Vitals:   12/28/22 0404 12/28/22 1713 12/28/22 2104 12/29/22 0548  BP: (!) 155/79 (!) 159/66 130/66 119/65  Pulse: 86 88 81 66  Resp: 18 18 20 20   Temp: 98.6 F (37 C) 97.8 F (36.6 C) 99.1 F (37.3 C) (!) 97.5 F (36.4 C)  TempSrc: Oral  Oral Oral  SpO2: 99% 98% 97% 93%  Weight:      Height:        Intake/Output Summary (Last 24 hours) at 12/29/2022 0802 Last data filed at 12/29/2022 0656 Gross per 24 hour  Intake 298.39 ml  Output 400 ml  Net -101.61 ml   Filed Weights   12/24/22 0900 12/26/22 0423 12/26/22 2046  Weight: 68 kg 64.6 kg 64.7 kg    Examination:  General exam: appears calm & comfortable  Respiratory system: diminished breath sounds b/l Cardiovascular system: S1 & S2+. No rubs or clicks Gastrointestinal system: abd is soft, NT, ND & hypoactive bowel sounds  Central nervous system: alert & oriented to self. Moves all extremities  Psychiatry: judgement and insight appears at baseline. Flat mood and affect     Data Reviewed: I have personally reviewed following labs and imaging studies  CBC: Recent Labs  Lab 12/24/22 0857 12/25/22 0453 12/26/22 0501 12/26/22 1909 12/27/22 0415 12/28/22 0420 12/29/22 0524  WBC 21.1* 10.6* 11.4*  --  10.0 9.0 8.0  NEUTROABS 18.7*  --   --   --   --   --   --   HGB 14.5 13.9 13.6 13.5 12.5* 14.0 13.7  HCT 43.7 42.0 41.3 39.5 36.6* 41.2 41.3  MCV 96.0 96.3 96.5  --  93.6 93.6 95.4  PLT 214 164 162  --  145* 161 144*   Basic Metabolic Panel: Recent Labs  Lab 12/25/22 0453 12/26/22 0501 12/27/22 0415 12/28/22 0420  12/29/22 0524  NA 138 141 139 139 138  K 3.8 3.9 3.3* 3.4* 4.1  CL 105 106 103 101 103  CO2 23 24 27 26 27   GLUCOSE 88 67* 86 110* 88  BUN 24* 24* 30* 32* 29*  CREATININE 1.08 1.14 1.08 1.01 0.89  CALCIUM 8.6* 9.0 8.8* 9.1 8.9   GFR: Estimated Creatinine Clearance: 49.5 mL/min (by C-G formula based on SCr of 0.89 mg/dL). Liver Function Tests: No results for input(s): "AST", "ALT", "ALKPHOS", "BILITOT", "PROT", "ALBUMIN" in the last 168 hours. No results for input(s): "LIPASE", "AMYLASE" in the last 168 hours. No results for input(s): "AMMONIA" in the last 168 hours. Coagulation Profile: No results for input(s): "INR", "PROTIME" in the last 168 hours. Cardiac Enzymes: Recent Labs  Lab 12/24/22 0857  CKTOTAL 854*   BNP (last 3 results) No results for input(s): "PROBNP" in the last 8760 hours. HbA1C: No results for input(s): "HGBA1C" in the last 72 hours.  CBG: Recent Labs  Lab 12/28/22 1217 12/28/22 1714 12/28/22 1737 12/28/22 2207 12/28/22 2216  GLUCAP 112* 134* 95 98 98   Lipid Profile: No results for input(s): "CHOL", "HDL", "LDLCALC", "TRIG", "CHOLHDL", "LDLDIRECT" in the last 72 hours. Thyroid Function Tests: No results for input(s): "TSH", "T4TOTAL", "FREET4", "T3FREE", "THYROIDAB" in the last 72 hours. Anemia Panel: No results for input(s): "VITAMINB12", "FOLATE", "FERRITIN", "TIBC", "IRON", "RETICCTPCT" in the last 72 hours. Sepsis Labs: Recent Labs  Lab 12/24/22 0857 12/24/22 1138 12/25/22 0453  PROCALCITON 0.69  --  2.37  LATICACIDVEN  --  1.1  --     Recent Results (from the past 240 hour(s))  SARS Coronavirus 2 by RT PCR (hospital order, performed in [facility] hospital lab) *cepheid single result test* Anterior Nasal Swab     Status: None   Collection Time: 12/24/22  2:06 PM   Specimen: Anterior Nasal Swab  Result Value Ref Range Status   SARS Coronavirus 2 by RT PCR NEGATIVE NEGATIVE Final    Comment: (NOTE) SARS-CoV-2 target nucleic acids  are NOT DETECTED.  The SARS-CoV-2 RNA is generally detectable in upper and lower respiratory specimens during the acute phase of infection. The lowest concentration of SARS-CoV-2 viral copies this assay can detect is 250 copies / mL. A negative result does not preclude SARS-CoV-2 infection and should not be used as the sole basis for treatment or other patient management decisions.  A negative result may occur with improper specimen collection / handling, submission of specimen other than nasopharyngeal swab, presence of viral mutation(s) within the areas targeted by this assay, and inadequate number of viral copies (<250 copies / mL). A negative result must be combined with clinical observations, patient history, and epidemiological information.  Fact Sheet for Patients:   RoadLapTop.co.za  Fact Sheet for Healthcare Providers: http://kim-miller.com/  This test is not yet approved or  cleared by the [place] and  has been authorized for detection and/or diagnosis of SARS-CoV-2 by FDA under an Emergency Use Authorization (EUA).  This EUA will remain in effect (meaning this test can be used) for the duration of the COVID-19 declaration under Section 564(b)(1) of the Act, 21 U.S.C. section 360bbb-3(b)(1), unless the authorization is terminated or revoked sooner.  Performed at [facility], [address] [place], [place] [address]   Blood culture (routine x 2)     Status: None (Preliminary result)   Collection Time: 12/24/22  2:06 PM   Specimen: BLOOD  Result Value Ref Range Status   Specimen Description BLOOD BLOOD LEFT FOREARM  Final   Special Requests   Final    BOTTLES DRAWN AEROBIC AND ANAEROBIC Blood Culture results may not be optimal due to an inadequate volume of blood received in culture bottles   Culture   Final    NO GROWTH 4 DAYS Performed at [facility], [address], [place], [place]  [address]    Report Status PENDING  Incomplete  Blood culture (routine x 2)     Status: Abnormal   Collection Time: 12/24/22  2:07 PM   Specimen: BLOOD  Result Value Ref Range Status   Specimen Description   Final    BLOOD BLOOD RIGHT FOREARM Performed at [facility], [address], [place], [place] [address]    Special Requests   Final    BOTTLES DRAWN AEROBIC AND ANAEROBIC Blood Culture results may not be optimal due to an inadequate volume of blood received in culture bottles Performed at [facility], [address] [place], [place] [address]    Culture  Setup Time   Final    GRAM POSITIVE COCCI IN BOTH AEROBIC AND ANAEROBIC BOTTLES Organism ID to follow CRITICAL RESULT CALLED TO, READ BACK BY AND VERIFIED WITH: [email or web address]  12/25/22 RH Performed at [facility], [address] [place], [place] [address]    Culture (A)  Final    STAPHYLOCOCCUS HOMINIS THE SIGNIFICANCE OF ISOLATING THIS ORGANISM FROM A SINGLE SET OF BLOOD CULTURES WHEN MULTIPLE SETS ARE DRAWN IS UNCERTAIN. PLEASE NOTIFY THE MICROBIOLOGY DEPARTMENT WITHIN ONE WEEK IF SPECIATION AND SENSITIVITIES ARE REQUIRED. Performed at [facility], [address], [place], [place] [address]    Report Status 12/27/2022 FINAL  Final  Blood Culture ID Panel (Reflexed)     Status: Abnormal   Collection Time: 12/24/22  2:07 PM  Result Value Ref Range Status   Enterococcus faecalis NOT DETECTED NOT DETECTED Final   Enterococcus Faecium NOT DETECTED NOT DETECTED Final   Listeria monocytogenes NOT DETECTED NOT DETECTED Final   Staphylococcus species DETECTED (A) NOT DETECTED Final    Comment: CRITICAL RESULT CALLED TO, READ BACK BY AND VERIFIED WITH: Harrold Donath [id number] 12/25/22 RH    Staphylococcus aureus (BCID) NOT DETECTED NOT DETECTED Final   Staphylococcus epidermidis NOT DETECTED NOT DETECTED Final   Staphylococcus lugdunensis NOT DETECTED NOT DETECTED Final   Streptococcus species NOT  DETECTED NOT DETECTED Final   Streptococcus agalactiae NOT DETECTED NOT DETECTED Final   Streptococcus pneumoniae NOT DETECTED NOT DETECTED Final   Streptococcus pyogenes NOT DETECTED NOT DETECTED Final   A.calcoaceticus-baumannii NOT DETECTED NOT DETECTED Final   Bacteroides fragilis NOT DETECTED NOT DETECTED Final   Enterobacterales NOT DETECTED NOT DETECTED Final   Enterobacter cloacae complex NOT DETECTED NOT DETECTED Final   Escherichia coli NOT DETECTED NOT DETECTED Final   Klebsiella aerogenes NOT DETECTED NOT DETECTED Final  Klebsiella oxytoca NOT DETECTED NOT DETECTED Final   Klebsiella pneumoniae NOT DETECTED NOT DETECTED Final   Proteus species NOT DETECTED NOT DETECTED Final   Salmonella species NOT DETECTED NOT DETECTED Final   Serratia marcescens NOT DETECTED NOT DETECTED Final   Haemophilus influenzae NOT DETECTED NOT DETECTED Final   Neisseria meningitidis NOT DETECTED NOT DETECTED Final   Pseudomonas aeruginosa NOT DETECTED NOT DETECTED Final   Stenotrophomonas maltophilia NOT DETECTED NOT DETECTED Final   Candida albicans NOT DETECTED NOT DETECTED Final   Candida auris NOT DETECTED NOT DETECTED Final   Candida glabrata NOT DETECTED NOT DETECTED Final   Candida krusei NOT DETECTED NOT DETECTED Final   Candida parapsilosis NOT DETECTED NOT DETECTED Final   Candida tropicalis NOT DETECTED NOT DETECTED Final   Cryptococcus neoformans/gattii NOT DETECTED NOT DETECTED Final    Comment: Performed at [facility], [address] [place], [place] [address]  Culture, blood (Routine X 2) w Reflex to ID Panel     Status: None (Preliminary result)   Collection Time: 12/25/22  9:19 AM   Specimen: BLOOD LEFT HAND  Result Value Ref Range Status   Specimen Description BLOOD LEFT HAND  Final   Special Requests   Final    BOTTLES DRAWN AEROBIC AND ANAEROBIC Blood Culture adequate volume   Culture   Final    NO GROWTH 3 DAYS Performed at [facility], [address] [place], [place] [address]    Report Status PENDING  Incomplete  Culture, blood (Routine X 2) w Reflex to ID Panel     Status: None (Preliminary result)   Collection Time: 12/25/22  9:27 AM   Specimen: BLOOD LEFT HAND  Result Value Ref Range Status   Specimen Description BLOOD LEFT HAND  Final   Special Requests   Final    BOTTLES DRAWN AEROBIC AND ANAEROBIC Blood Culture adequate volume   Culture   Final    NO GROWTH 3 DAYS Performed at [facility], [address], [place], [place] [address]    Report Status PENDING  Incomplete  Urine Culture (for pregnant, neutropenic or urologic patients or patients with an indwelling urinary catheter)     Status: None   Collection Time: 12/25/22 10:29 AM   Specimen: In/Out Cath Urine  Result Value Ref Range Status   Specimen Description   Final    IN/OUT CATH URINE Performed at [facility], [address], [place], [place] [address]    Special Requests   Final    NONE Performed at [facility], [address], [place], [place] [address]    Culture   Final    NO GROWTH Performed at [facility], [address], [place], [place] [address]    Report Status 12/26/2022 FINAL  Final         Radiology Studies: DG Chest Port 1 View  Result Date: 12/28/2022 CLINICAL DATA:  Leukocytosis, tachypnea.  Aspiration pneumonia. EXAM: PORTABLE CHEST 1 VIEW COMPARISON:  December 24, 2022. FINDINGS: The heart size and mediastinal contours are within normal limits. Calcified pleural plaques are again noted bilaterally consistent with asbestos exposure. Minimal bibasilar subsegmental atelectasis or scarring is noted. The visualized skeletal structures are unremarkable. IMPRESSION: Minimal bibasilar subsegmental atelectasis or scarring. Calcified pleural plaques are noted bilaterally consistent with asbestos exposure. Electronically Signed   By: Lupita Raider M.D.   On: 12/28/2022 10:42        Scheduled  Meds:  Chlorhexidine  Gluconate Cloth  6 each Topical Daily   heparin  5,000 Units Subcutaneous Q8H   influenza vaccine adjuvanted  0.5 mL Intramuscular Tomorrow-1000   insulin aspart  0-5 Units Subcutaneous QHS   insulin aspart  0-9 Units Subcutaneous TID WC   nystatin   Topical TID   pantoprazole (PROTONIX) IV  40 mg Intravenous Q24H   scopolamine  1 patch Transdermal Q72H   Continuous Infusions:  cefTRIAXone (ROCEPHIN)  IV Stopped (12/28/22 1132)     LOS: 5 days       Charise Killian, MD [facility] Pager [phone]  If 7PM-7AM, please contact night-coverage [email or web address] 12/29/2022, 8:02 AM

## 2022-12-29 NOTE — [place] FL2 (Signed)
[place] MEDICAID FL2 LEVEL OF CARE FORM     IDENTIFICATION  Patient Name: Adam [name] Birthdate: [birth year] Sex: male Admission Date (Current Location): 12/24/2022  [facility] and [place] Number:  Chiropodist and Address:  [facility], [address], [place], [place] [address]      Provider Number: [id number]  Attending Physician Name and Address:  Charise Killian, MD  Relative Name and Phone Number:  Vernona Rieger (daughter )[phone]    Current Level of Care: Hospital Recommended Level of Care: Skilled Nursing Facility (memory care) Prior Approval Number:    Date Approved/Denied:   PASRR Number: [id number]  Discharge Plan: Other (Comment) (memory care)    Current Diagnoses: Patient Active Problem List   Diagnosis Date Noted   Palliative care encounter 12/28/2022   Sepsis (HCC) 12/24/2022   AMS (altered mental status) 12/24/2022   Elevated serum creatinine 12/24/2022   Candida rash of groin 12/24/2022   Essential hypertension 01/04/2016   Gastroesophageal reflux disease without esophagitis 01/04/2016   Pure hypercholesterolemia 01/04/2016   Type 2 diabetes mellitus with stage 3 chronic kidney disease, without long-term current use of insulin (HCC) 01/04/2016   Primary osteoarthritis of left knee 07/10/2015   Primary osteoarthritis of right knee 07/10/2015   BPH with obstruction/lower urinary tract symptoms 03/04/2015    Orientation RESPIRATION BLADDER Height & Weight     Self, Place  Normal Incontinent Weight: 142 lb 10.2 oz (64.7 kg) Height:  5\' 7"  (170.2 cm)  BEHAVIORAL SYMPTOMS/MOOD NEUROLOGICAL BOWEL NUTRITION STATUS      Continent Diet (see discharge summary)  AMBULATORY STATUS COMMUNICATION OF NEEDS Skin   Extensive Assist Verbally Normal                       Personal Care Assistance Level of Assistance  Bathing, Feeding, Dressing, Total care Bathing Assistance: Maximum assistance Feeding assistance:  Limited assistance Dressing Assistance: Maximum assistance Total Care Assistance: Maximum assistance   Functional Limitations Info  Sight, Hearing, Speech Sight Info: Impaired Hearing Info: Adequate Speech Info: Adequate    SPECIAL CARE FACTORS FREQUENCY                       Contractures Contractures Info: Not present    Additional Factors Info  Code Status, Allergies Code Status Info: full Allergies Info: penicillin v potassium           Current Medications (12/29/2022):  This is the current hospital active medication list Current Facility-Administered Medications  Medication Dose Route Frequency Provider Last Rate Last Admin   acetaminophen (TYLENOL) tablet 650 mg  650 mg Oral Q6H PRN Cox, Amy N, DO       Or   acetaminophen (TYLENOL) suppository 650 mg  650 mg Rectal Q6H PRN Cox, Amy N, DO       cefTRIAXone (ROCEPHIN) 1 g in sodium chloride 0.9 % 100 mL IVPB  1 g Intravenous Q24H Charise Killian, MD 200 mL/hr at 12/29/22 0810 1 g at 12/29/22 0810   Chlorhexidine Gluconate Cloth 2 % PADS 6 each  6 each Topical Daily Charise Killian, MD   6 each at 12/29/22 0811   glycopyrrolate (ROBINUL) injection 0.2 mg  0.2 mg Intravenous QID PRN Charise Killian, MD   0.2 mg at 12/27/22 1334   haloperidol lactate (HALDOL) injection 1-2 mg  1-2 mg Intramuscular Q6H PRN Mansy, Jan A, MD   1 mg at 12/28/22 0254  heparin injection 5,000 Units  5,000 Units Subcutaneous Q8H Cox, Amy N, DO   5,000 Units at 12/29/22 0553   influenza vaccine adjuvanted (FLUAD) injection 0.5 mL  0.5 mL Intramuscular Tomorrow-1000 Charise Killian, MD       insulin aspart (novoLOG) injection 0-5 Units  0-5 Units Subcutaneous QHS Cox, Amy N, DO       insulin aspart (novoLOG) injection 0-9 Units  0-9 Units Subcutaneous TID WC Cox, Amy N, DO       morphine (PF) 2 MG/ML injection 1 mg  1 mg Intravenous Q3H PRN Charise Killian, MD   1 mg at 12/28/22 2034   nystatin (MYCOSTATIN/NYSTOP) topical  powder   Topical TID Cox, Amy N, DO   Given at 12/29/22 0811   ondansetron (ZOFRAN) tablet 4 mg  4 mg Oral Q6H PRN Cox, Amy N, DO       Or   ondansetron (ZOFRAN) injection 4 mg  4 mg Intravenous Q6H PRN Cox, Amy N, DO       pantoprazole (PROTONIX) injection 40 mg  40 mg Intravenous Q24H Charise Killian, MD   40 mg at 12/28/22 1743   scopolamine (TRANSDERM-SCOP) 1 MG/3DAYS 1.5 mg  1 patch Transdermal Q72H Charise Killian, MD   1.5 mg at 12/26/22 1915   senna-docusate (Senokot-S) tablet 1 tablet  1 tablet Oral QHS PRN Cox, Amy N, DO       ziprasidone (GEODON) injection 10 mg  10 mg Intramuscular Q6H PRN Mansy, Jan A, MD   10 mg at 12/25/22 0108     Discharge Medications: Please see discharge summary for a list of discharge medications.  Relevant Imaging Results:  Relevant Lab Results:   Additional Information SSN: [id number]  Allena Katz, LCSW

## 2022-12-30 DIAGNOSIS — A419 Sepsis, unspecified organism: Secondary | ICD-10-CM | POA: Diagnosis not present

## 2022-12-30 DIAGNOSIS — F03918 Unspecified dementia, unspecified severity, with other behavioral disturbance: Secondary | ICD-10-CM | POA: Diagnosis not present

## 2022-12-30 LAB — CBC
HCT: 39 % (ref 39.0–52.0)
Hemoglobin: 13.2 g/dL (ref 13.0–17.0)
MCH: 32 pg (ref 26.0–34.0)
MCHC: 33.8 g/dL (ref 30.0–36.0)
MCV: 94.7 fL (ref 80.0–100.0)
Platelets: 152 10*3/uL (ref 150–400)
RBC: 4.12 MIL/uL — ABNORMAL LOW (ref 4.22–5.81)
RDW: 12.4 % (ref 11.5–15.5)
WBC: 6.9 10*3/uL (ref 4.0–10.5)
nRBC: 0 % (ref 0.0–0.2)

## 2022-12-30 LAB — BASIC METABOLIC PANEL
Anion gap: 8 (ref 5–15)
BUN: 39 mg/dL — ABNORMAL HIGH (ref 8–23)
CO2: 29 mmol/L (ref 22–32)
Calcium: 9 mg/dL (ref 8.9–10.3)
Chloride: 104 mmol/L (ref 98–111)
Creatinine, Ser: 0.98 mg/dL (ref 0.61–1.24)
GFR, Estimated: >60 mL/min (ref >60)
Glucose, Bld: 112 mg/dL — ABNORMAL HIGH (ref 70–99)
Potassium: 3.5 mmol/L (ref 3.5–5.1)
Sodium: 141 mmol/L (ref 135–145)

## 2022-12-30 LAB — CULTURE, BLOOD (ROUTINE X 2)
Culture: NO GROWTH
Culture: NO GROWTH
Special Requests: ADEQUATE
Special Requests: ADEQUATE

## 2022-12-30 LAB — GLUCOSE, CAPILLARY
Glucose-Capillary: 98 mg/dL (ref 70–99)
Glucose-Capillary: 121 mg/dL — ABNORMAL HIGH (ref 70–99)
Glucose-Capillary: 110 mg/dL — ABNORMAL HIGH (ref 70–99)
Glucose-Capillary: 172 mg/dL — ABNORMAL HIGH (ref 70–99)

## 2022-12-30 NOTE — Plan of Care (Signed)
  Problem: Fluid Volume: Goal: Hemodynamic stability will improve Outcome: Progressing   Problem: Clinical Measurements: Goal: Diagnostic test results will improve Outcome: Progressing Goal: Signs and symptoms of infection will decrease Outcome: Progressing   Problem: Respiratory: Goal: Ability to maintain adequate ventilation will improve Outcome: Progressing   Problem: Education: Goal: Ability to describe self-care measures that may prevent or decrease complications (Diabetes Survival Skills Education) will improve Outcome: Progressing Goal: Individualized Educational Video(s) Outcome: Progressing   Problem: Coping: Goal: Ability to adjust to condition or change in health will improve Outcome: Progressing   Problem: Fluid Volume: Goal: Ability to maintain a balanced intake and output will improve Outcome: Progressing   Problem: Health Behavior/Discharge Planning: Goal: Ability to identify and utilize available resources and services will improve Outcome: Progressing Goal: Ability to manage health-related needs will improve Outcome: Progressing   Problem: Metabolic: Goal: Ability to maintain appropriate glucose levels will improve Outcome: Progressing   Problem: Nutritional: Goal: Maintenance of adequate nutrition will improve Outcome: Progressing Goal: Progress toward achieving an optimal weight will improve Outcome: Progressing   Problem: Skin Integrity: Goal: Risk for impaired skin integrity will decrease Outcome: Progressing   Problem: Tissue Perfusion: Goal: Adequacy of tissue perfusion will improve Outcome: Progressing   Problem: Education: Goal: Knowledge of General Education information will improve Description: Including pain rating scale, medication(s)/side effects and non-pharmacologic comfort measures Outcome: Progressing   Problem: Health Behavior/Discharge Planning: Goal: Ability to manage health-related needs will improve Outcome:  Progressing   Problem: Clinical Measurements: Goal: Ability to maintain clinical measurements within normal limits will improve Outcome: Progressing Goal: Will remain free from infection Outcome: Progressing Goal: Diagnostic test results will improve Outcome: Progressing Goal: Respiratory complications will improve Outcome: Progressing Goal: Cardiovascular complication will be avoided Outcome: Progressing   Problem: Activity: Goal: Risk for activity intolerance will decrease Outcome: Progressing  Oob to chair this shift by Physical Therapy Problem: Nutrition: Goal: Adequate nutrition will be maintained Outcome: Progressing   Problem: Coping: Goal: Level of anxiety will decrease Outcome: Progressing   Problem: Elimination: Goal: Will not experience complications related to bowel motility Outcome: Progressing Goal: Will not experience complications related to urinary retention Outcome: Progressing   Problem: Pain Managment: Goal: General experience of comfort will improve Outcome: Progressing   Problem: Safety: Goal: Ability to remain free from injury will improve Outcome: Progressing   Problem: Skin Integrity: Goal: Risk for impaired skin integrity will decrease Outcome: Progressing   Problem: Safety: Goal: Non-violent Restraint(s) Outcome: Progressing  Mittens applied to hands to prevent pt from pulling foley catheter out

## 2022-12-30 NOTE — Care Management Important Message (Signed)
Important Message  Patient Details  Name: Adam [name] MRN: [id number] Date of Birth: 1929/08/06   Medicare Important Message Given:  N/A - LOS <3 / Initial given by admissions     Olegario Messier A [name] 12/30/2022, 8:21 AM

## 2022-12-30 NOTE — Plan of Care (Signed)
  Problem: Fluid Volume: Goal: Hemodynamic stability will improve Outcome: Progressing   Problem: Clinical Measurements: Goal: Diagnostic test results will improve Outcome: Progressing Goal: Signs and symptoms of infection will decrease Outcome: Progressing   Problem: Respiratory: Goal: Ability to maintain adequate ventilation will improve Outcome: Progressing   Problem: Education: Goal: Ability to describe self-care measures that may prevent or decrease complications (Diabetes Survival Skills Education) will improve Outcome: Progressing Goal: Individualized Educational Video(s) Outcome: Progressing   Problem: Coping: Goal: Ability to adjust to condition or change in health will improve Outcome: Progressing   Problem: Fluid Volume: Goal: Ability to maintain a balanced intake and output will improve Outcome: Progressing   Problem: Health Behavior/Discharge Planning: Goal: Ability to identify and utilize available resources and services will improve Outcome: Progressing Goal: Ability to manage health-related needs will improve Outcome: Progressing   Problem: Metabolic: Goal: Ability to maintain appropriate glucose levels will improve Outcome: Progressing   Problem: Nutritional: Goal: Maintenance of adequate nutrition will improve Outcome: Progressing Goal: Progress toward achieving an optimal weight will improve Outcome: Progressing   Problem: Skin Integrity: Goal: Risk for impaired skin integrity will decrease Outcome: Progressing   Problem: Tissue Perfusion: Goal: Adequacy of tissue perfusion will improve Outcome: Progressing   Problem: Clinical Measurements: Goal: Ability to maintain clinical measurements within normal limits will improve Outcome: Progressing Goal: Will remain free from infection Outcome: Progressing Goal: Diagnostic test results will improve Outcome: Progressing Goal: Respiratory complications will improve Outcome: Progressing Goal:  Cardiovascular complication will be avoided Outcome: Progressing   Problem: Health Behavior/Discharge Planning: Goal: Ability to manage health-related needs will improve Outcome: Progressing

## 2022-12-30 NOTE — Progress Notes (Addendum)
PROGRESS NOTE   HPI was taken from Dr. Sedalia Muta: [name] is a 89 year old male with history of dementia, hyperlipidemia, hypertension, GERD, who presents to the emergency department for chief concerns of altered mental status.   Per ED documentation, patient was [place] the woods wandering by his neighbor.   Vitals in the ED showed temperature of 95.4, improved to 97.7 with bear hugger, respiration rate 21, heart rate of 86, blood pressure 150/93, SpO2 97% on room air.   Serum sodium is 138, potassium 4.7, chloride 102, bicarb 24, BUN of 35, serum creatinine 1.60, EGFR 40, nonfasting blood glucose 111, WBC 21.1, hemoglobin 14.5, platelets of 214.   UA was negative for leukocytes and nitrates, and microscopy was read as no bacteria.   ED treatment: Cefepime 2 g IV, Flagyl 500 mg IV, vancomycin 1 g IV, lactated ringer 1 L bolus. ----------------------------- At bedside, patient is able to tell me his name, age, current location of hospital. He was not able to tell me the current month or the current year. I asked him: What is 2 + 2? Patient: 4. When I commented that to patient that he was able to add 2 + 2 but was not able to tell me the current year or month. Patient stated 'sometimes you catch me with it'.    He was not able to tell me why he was the hospital. He states he thought he had an appointment.    He states he lives at home by himself. He states he thinks he has 5 children. He reports that he has been going through a lot of distress lately as his wife passed away last 01/24/23. He then became tearful at bedside.    Social history: He lives at home by himself. He denies tobacco, etoh, and recreational drug use. He is retired and formerly worked as a Production designer, theatre/television/film in [employer].    ROS: unable to complete as patient appears to have dementia complicated by altered mental status.    ED Course: Discussed with emergency medicine provider, patient requiring hospitalization  for chief concerns of altered mental status, possible sepsis.   As per Dr. Mayford Knife 9/5-9/10/24: Pt presented w/ sepsis possibly secondary to UTI vs aspiration pneumonia. Urine cx showed no growth but was collected after abxs were given. Completed abx course. Of note, speech eval pt and recommended strict NPO and did not recommend a feeding tube. Pt was started on dysphagia I diet for comfort only and palliative care evaluated the pt and spoke with the pt's family. Pt's family want to continue w/ current scope of treatment and will consider hospice care if pt continues to decline. PT/OT evaluated pt and recommends SNF. CM is working on SNF placement. Pt's family is agreeable to SNF placement   Adam [name]  NWG:[id number] DOB: 1930-02-04 DOA: 12/24/2022 PCP: Adam Goodell, MD   Assessment & Plan:   Principal Problem:   Sepsis [facility]) Active Problems:   Essential hypertension   Gastroesophageal reflux disease without esophagitis   Primary osteoarthritis of left knee   Primary osteoarthritis of right knee   Pure hypercholesterolemia   Type 2 diabetes mellitus with stage 3 chronic kidney disease, without long-term current use of insulin (HCC)   AMS (altered mental status)   Elevated serum creatinine   Candida rash of groin   Palliative care encounter  Assessment and Plan: Sepsis: met criteria w/ leukocytosis, tachypnea, hypothermia but unknown source, possibly UTI. Completed abx course w/ azithromycin, rocephin.  Procal 2.37. Concern for aspiration pneumonia w/ significant dysphagia. Sepsis resolved  R/o bacteremia: blood cxs growing staph, likely containment. Repeat blood cxs NGTD  Dementia: oriented to self & place only.Continue w/ supportive care. Palliative care recs apprec   Dysphagia: likely secondary to dementia. Strict NPO as per speech but continue on dysphagia I diet for comfort only   Hypokalemia: WNL today   Hematuria: likely secondary to pt pulling on cath tubing. H&H  are WNL    Candida rash of groin: completed 5 day course of nystatin powder   Acute encephalopathy: etiology unclear, infection vs delirium vs worsening dementia. Re-orient prn. Mental status is back to baseline  DM2: likely well controlled. Continue on SSI w/ accuchecks   Likely AKI: baseline Cr/GFR is unknown. Resolved   Urinary retention: foley placed in ER 12/25/22 and will do a voiding trial today. Urine cx ordered secondary c/o dysuria & meeting criteria for sepsis. Urine cx shows no growth but abxs were given prior to urine being collected for urine cx    HLD: continue on statin   HTN: no longer taking hydrochlorothiazide,enalapril. IV hydralazine prn        DVT prophylaxis: heparin  Code Status: DNR  Family Communication:  Disposition Plan: likely d/c to SNF  Level of care: Med-Surg Status is: Inpatient Remains inpatient appropriate because: medically stable. Needs SNF placement     Consultants:  Palliative care ID   Procedures:   Antimicrobials:    Subjective: Pt c/o being hungry   Objective: Vitals:   12/29/22 0827 12/29/22 1649 12/29/22 2042 12/30/22 0756  BP: (!) 149/85 126/63 (!) 156/70 131/70  Pulse: 68 79 80 73  Resp: 18 15 18 14   Temp: 98.5 F (36.9 C)  98.1 F (36.7 C) 98 F (36.7 C)  TempSrc:      SpO2: 97% 100% 96% 98%  Weight:      Height:        Intake/Output Summary (Last 24 hours) at 12/30/2022 0811 Last data filed at 12/30/2022 0650 Gross per 24 hour  Intake 480 ml  Output 1602 ml  Net -1122 ml   Filed Weights   12/24/22 0900 12/26/22 0423 12/26/22 2046  Weight: 68 kg 64.6 kg 64.7 kg    Examination:  General exam: appears comfortable  Respiratory system: decreased breath sounds b/l  Cardiovascular system: S1/S2+. No rubs or clicks  Gastrointestinal system: abd is soft, NT, ND & normal bowel sounds  Central nervous system: alert & awake. Moves all extremities  Psychiatry: judgement and insight appears at baseline. Flat  mood and affect     Data Reviewed: I have personally reviewed following labs and imaging studies  CBC: Recent Labs  Lab 12/24/22 0857 12/25/22 0453 12/26/22 0501 12/26/22 1909 12/27/22 0415 12/28/22 0420 12/29/22 0524 12/30/22 0407  WBC 21.1*   < > 11.4*  --  10.0 9.0 8.0 6.9  NEUTROABS 18.7*  --   --   --   --   --   --   --   HGB 14.5   < > 13.6 13.5 12.5* 14.0 13.7 13.2  HCT 43.7   < > 41.3 39.5 36.6* 41.2 41.3 39.0  MCV 96.0   < > 96.5  --  93.6 93.6 95.4 94.7  PLT 214   < > 162  --  145* 161 144* 152   < > = values in this interval not displayed.   Basic Metabolic Panel: Recent Labs  Lab 12/26/22 0501 12/27/22 0415 12/28/22  7829 12/29/22 0524 12/30/22 0407  NA 141 139 139 138 141  K 3.9 3.3* 3.4* 4.1 3.5  CL 106 103 101 103 104  CO2 24 27 26 27 29   GLUCOSE 67* 86 110* 88 112*  BUN 24* 30* 32* 29* 39*  CREATININE 1.14 1.08 1.01 0.89 0.98  CALCIUM 9.0 8.8* 9.1 8.9 9.0   GFR: Estimated Creatinine Clearance: 44.9 mL/min (by C-G formula based on SCr of 0.98 mg/dL). Liver Function Tests: No results for input(s): "AST", "ALT", "ALKPHOS", "BILITOT", "PROT", "ALBUMIN" in the last 168 hours. No results for input(s): "LIPASE", "AMYLASE" in the last 168 hours. No results for input(s): "AMMONIA" in the last 168 hours. Coagulation Profile: No results for input(s): "INR", "PROTIME" in the last 168 hours. Cardiac Enzymes: Recent Labs  Lab 12/24/22 0857  CKTOTAL 854*   BNP (last 3 results) No results for input(s): "PROBNP" in the last 8760 hours. HbA1C: No results for input(s): "HGBA1C" in the last 72 hours.  CBG: Recent Labs  Lab 12/29/22 0828 12/29/22 1150 12/29/22 1649 12/29/22 2144 12/30/22 0757  GLUCAP 98 179* 197* 121* 98   Lipid Profile: No results for input(s): "CHOL", "HDL", "LDLCALC", "TRIG", "CHOLHDL", "LDLDIRECT" in the last 72 hours. Thyroid Function Tests: No results for input(s): "TSH", "T4TOTAL", "FREET4", "T3FREE", "THYROIDAB" in the last  72 hours. Anemia Panel: No results for input(s): "VITAMINB12", "FOLATE", "FERRITIN", "TIBC", "IRON", "RETICCTPCT" in the last 72 hours. Sepsis Labs: Recent Labs  Lab 12/24/22 0857 12/24/22 1138 12/25/22 0453  PROCALCITON 0.69  --  2.37  LATICACIDVEN  --  1.1  --     Recent Results (from the past 240 hour(s))  SARS Coronavirus 2 by RT PCR (hospital order, performed in [facility] hospital lab) *cepheid single result test* Anterior Nasal Swab     Status: None   Collection Time: 12/24/22  2:06 PM   Specimen: Anterior Nasal Swab  Result Value Ref Range Status   SARS Coronavirus 2 by RT PCR NEGATIVE NEGATIVE Final    Comment: (NOTE) SARS-CoV-2 target nucleic acids are NOT DETECTED.  The SARS-CoV-2 RNA is generally detectable in upper and lower respiratory specimens during the acute phase of infection. The lowest concentration of SARS-CoV-2 viral copies this assay can detect is 250 copies / mL. A negative result does not preclude SARS-CoV-2 infection and should not be used as the sole basis for treatment or other patient management decisions.  A negative result may occur with improper specimen collection / handling, submission of specimen other than nasopharyngeal swab, presence of viral mutation(s) within the areas targeted by this assay, and inadequate number of viral copies (<250 copies / mL). A negative result must be combined with clinical observations, patient history, and epidemiological information.  Fact Sheet for Patients:   RoadLapTop.co.za  Fact Sheet for Healthcare Providers: http://kim-miller.com/  This test is not yet approved or  cleared by the [place] FDA and has been authorized for detection and/or diagnosis of SARS-CoV-2 by FDA under an Emergency Use Authorization (EUA).  This EUA will remain in effect (meaning this test can be used) for the duration of the COVID-19 declaration under Section 564(b)(1) of the  Act, 21 U.S.C. section 360bbb-3(b)(1), unless the authorization is terminated or revoked sooner.  Performed at [facility], [address] [place], [place] [address]   Blood culture (routine x 2)     Status: None   Collection Time: 12/24/22  2:06 PM   Specimen: BLOOD  Result Value Ref Range Status  Specimen Description BLOOD BLOOD LEFT FOREARM  Final   Special Requests   Final    BOTTLES DRAWN AEROBIC AND ANAEROBIC Blood Culture results may not be optimal due to an inadequate volume of blood received in culture bottles   Culture   Final    NO GROWTH 5 DAYS Performed at [facility], [address] [place], [place] [address]    Report Status 12/29/2022 FINAL  Final  Blood culture (routine x 2)     Status: Abnormal   Collection Time: 12/24/22  2:07 PM   Specimen: BLOOD  Result Value Ref Range Status   Specimen Description   Final    BLOOD BLOOD RIGHT FOREARM Performed at [facility], [address], [place], [place] [address]    Special Requests   Final    BOTTLES DRAWN AEROBIC AND ANAEROBIC Blood Culture results may not be optimal due to an inadequate volume of blood received in culture bottles Performed at [facility], [address] [place], [place] [address]    Culture  Setup Time   Final    GRAM POSITIVE COCCI IN BOTH AEROBIC AND ANAEROBIC BOTTLES Organism ID to follow CRITICAL RESULT CALLED TO, READ BACK BY AND VERIFIED WITH: [email or web address]  12/25/22 RH Performed at [facility], [address] [place] [address] [place], [place] [address]    Culture (A)  Final    STAPHYLOCOCCUS HOMINIS THE SIGNIFICANCE OF ISOLATING THIS ORGANISM FROM A SINGLE SET OF BLOOD CULTURES WHEN MULTIPLE SETS ARE DRAWN IS UNCERTAIN. PLEASE NOTIFY THE MICROBIOLOGY DEPARTMENT WITHIN ONE WEEK IF SPECIATION AND SENSITIVITIES ARE REQUIRED. Performed at [facility], [address], [place], [place] [address]    Report Status 12/27/2022 FINAL  Final  Blood  Culture ID Panel (Reflexed)     Status: Abnormal   Collection Time: 12/24/22  2:07 PM  Result Value Ref Range Status   Enterococcus faecalis NOT DETECTED NOT DETECTED Final   Enterococcus Faecium NOT DETECTED NOT DETECTED Final   Listeria monocytogenes NOT DETECTED NOT DETECTED Final   Staphylococcus species DETECTED (A) NOT DETECTED Final    Comment: CRITICAL RESULT CALLED TO, READ BACK BY AND VERIFIED WITH: Harrold Donath [id number] 12/25/22 RH    Staphylococcus aureus (BCID) NOT DETECTED NOT DETECTED Final   Staphylococcus epidermidis NOT DETECTED NOT DETECTED Final   Staphylococcus lugdunensis NOT DETECTED NOT DETECTED Final   Streptococcus species NOT DETECTED NOT DETECTED Final   Streptococcus agalactiae NOT DETECTED NOT DETECTED Final   Streptococcus pneumoniae NOT DETECTED NOT DETECTED Final   Streptococcus pyogenes NOT DETECTED NOT DETECTED Final   A.calcoaceticus-baumannii NOT DETECTED NOT DETECTED Final   Bacteroides fragilis NOT DETECTED NOT DETECTED Final   Enterobacterales NOT DETECTED NOT DETECTED Final   Enterobacter cloacae complex NOT DETECTED NOT DETECTED Final   Escherichia coli NOT DETECTED NOT DETECTED Final   Klebsiella aerogenes NOT DETECTED NOT DETECTED Final   Klebsiella oxytoca NOT DETECTED NOT DETECTED Final   Klebsiella pneumoniae NOT DETECTED NOT DETECTED Final   Proteus species NOT DETECTED NOT DETECTED Final   Salmonella species NOT DETECTED NOT DETECTED Final   Serratia marcescens NOT DETECTED NOT DETECTED Final   Haemophilus influenzae NOT DETECTED NOT DETECTED Final   Neisseria meningitidis NOT DETECTED NOT DETECTED Final   Pseudomonas aeruginosa NOT DETECTED NOT DETECTED Final   Stenotrophomonas maltophilia NOT DETECTED NOT DETECTED Final   Candida albicans NOT DETECTED NOT DETECTED Final   Candida auris NOT DETECTED NOT DETECTED Final   Candida glabrata NOT  DETECTED NOT DETECTED Final   Candida krusei NOT DETECTED NOT DETECTED Final   Candida parapsilosis  NOT DETECTED NOT DETECTED Final   Candida tropicalis NOT DETECTED NOT DETECTED Final   Cryptococcus neoformans/gattii NOT DETECTED NOT DETECTED Final    Comment: Performed at [facility], [address] [place], [place] [address]  Culture, blood (Routine X 2) w Reflex to ID Panel     Status: None (Preliminary result)   Collection Time: 12/25/22  9:19 AM   Specimen: BLOOD LEFT HAND  Result Value Ref Range Status   Specimen Description BLOOD LEFT HAND  Final   Special Requests   Final    BOTTLES DRAWN AEROBIC AND ANAEROBIC Blood Culture adequate volume   Culture   Final    NO GROWTH 4 DAYS Performed at [facility], [address], [place], [place] [address]    Report Status PENDING  Incomplete  Culture, blood (Routine X 2) w Reflex to ID Panel     Status: None (Preliminary result)   Collection Time: 12/25/22  9:27 AM   Specimen: BLOOD LEFT HAND  Result Value Ref Range Status   Specimen Description BLOOD LEFT HAND  Final   Special Requests   Final    BOTTLES DRAWN AEROBIC AND ANAEROBIC Blood Culture adequate volume   Culture   Final    NO GROWTH 4 DAYS Performed at [facility], [address] [place], [place] [address]    Report Status PENDING  Incomplete  Urine Culture (for pregnant, neutropenic or urologic patients or patients with an indwelling urinary catheter)     Status: None   Collection Time: 12/25/22 10:29 AM   Specimen: In/Out Cath Urine  Result Value Ref Range Status   Specimen Description   Final    IN/OUT CATH URINE Performed at [facility], [address] [place], [place] [address]    Special Requests   Final    NONE Performed at [facility], [address] [place], [place] [address]    Culture   Final    NO GROWTH Performed at [facility], [address], [place], [place] [address]    Report Status 12/26/2022 FINAL  Final         Radiology Studies: No results found.      Scheduled  Meds:  Chlorhexidine Gluconate Cloth  6 each Topical Daily   heparin  5,000 Units Subcutaneous Q8H   influenza vaccine adjuvanted  0.5 mL Intramuscular Tomorrow-1000   insulin aspart  0-5 Units Subcutaneous QHS   insulin aspart  0-9 Units Subcutaneous TID WC   pantoprazole (PROTONIX) IV  40 mg Intravenous Q24H   scopolamine  1 patch Transdermal Q72H   Continuous Infusions:  cefTRIAXone (ROCEPHIN)  IV 1 g (12/29/22 0810)     LOS: 6 days       Charise Killian, MD [facility] Pager [phone]  If 7PM-7AM, please contact night-coverage [email or web address] 12/30/2022, 8:11 AM

## 2022-12-30 NOTE — Plan of Care (Signed)
  Problem: Fluid Volume: Goal: Hemodynamic stability will improve Outcome: Progressing   Problem: Clinical Measurements: Goal: Diagnostic test results will improve Outcome: Progressing   Problem: Coping: Goal: Ability to adjust to condition or change in health will improve Outcome: Progressing   Problem: Health Behavior/Discharge Planning: Goal: Ability to identify and utilize available resources and services will improve Outcome: Progressing

## 2022-12-30 NOTE — Progress Notes (Signed)
Physical Therapy Treatment Patient Details Name: Adam [name] MRN: [id number] DOB: 04/19/1930 Today's Date: 12/30/2022   History of Present Illness [name] is a 89 year old male with history of dementia, hyperlipidemia, hypertension, GERD, who presents to the emergency department for chief concerns of altered mental status after found wandering outside of house by neighbour. Admitted for sepsis    PT Comments  Patient received in bed, he is confused about situation, but able to tell me his daughter and son in law are present in room. He is agreeable to PT session. Requires max assist for supine to sit and positioning at edge of bed. He was unable to stand with +1 assist from low bed. Required bed to be significantly elevated to squat pivot to recliner with max A. Patient will continue to benefit from skilled PT to improve functional mobility and independence.       If plan is discharge home, recommend the following: Two people to help with walking and/or transfers;A lot of help with bathing/dressing/bathroom   Can travel by private vehicle     No  Equipment Recommendations  None recommended by PT;Other (comment) (TBD)    Recommendations for Other Services       Precautions / Restrictions Precautions Precautions: Fall Restrictions Weight Bearing Restrictions: No     Mobility  Bed Mobility Overal bed mobility: Needs Assistance Bed Mobility: Supine to Sit     Supine to sit: Max assist, HOB elevated     General bed mobility comments: Patient is able to initiate LEs over to edge of bed, but ultimately required max assist to get seated edge of bed with feet on floor.    Transfers Overall transfer level: Needs assistance Equipment used: None Transfers: Bed to chair/wheelchair/BSC Sit to Stand: Total assist     Squat pivot transfers: Total assist     General transfer comment: patient unable to assist with sit to stand from elevated bed. Requires total assist  for squat pivot to recliner from moderately elevated bed.    Ambulation/Gait               General Gait Details: unable   Stairs             Wheelchair Mobility     Tilt Bed    Modified Rankin (Stroke Patients Only)       Balance Overall balance assessment: Needs assistance Sitting-balance support: Feet supported Sitting balance-Leahy Scale: Fair     Standing balance support: Single extremity supported Standing balance-Leahy Scale: Poor                              Cognition Arousal: Alert Behavior During Therapy: WFL for tasks assessed/performed Overall Cognitive Status: Impaired/Different from baseline Area of Impairment: Orientation, Awareness, Problem solving, Memory                 Orientation Level: Disoriented to, Place, Time, Situation   Memory: Decreased short-term memory Following Commands: Follows one step commands consistently Safety/Judgement: Decreased awareness of safety Awareness: Intellectual Problem Solving: Slow processing, Requires verbal cues, Requires tactile cues, Decreased initiation, Difficulty sequencing General Comments: significantly extended time perform mobility        Exercises Other Exercises Other Exercises: performed LAQ seated edge of bed x 10 reps    General Comments        Pertinent Vitals/Pain Pain Assessment Pain Assessment: No/denies pain    Home Living  Prior Function            PT Goals (current goals can now be found in the care plan section) Acute Rehab PT Goals Patient Stated Goal: to improve mobility PT Goal Formulation: With patient/family Time For Goal Achievement: 01/12/23 Potential to Achieve Goals: Fair Progress towards PT goals: Not progressing toward goals - comment (continues to require max +2  assist for all mobility)    Frequency    Min 1X/week      PT Plan      Co-evaluation              AM-PAC PT "6  Clicks" Mobility   Outcome Measure  Help needed turning from your back to your side while in a flat bed without using bedrails?: A Lot Help needed moving from lying on your back to sitting on the side of a flat bed without using bedrails?: A Lot Help needed moving to and from a bed to a chair (including a wheelchair)?: Total Help needed standing up from a chair using your arms (e.g., wheelchair or bedside chair)?: Total Help needed to walk in hospital room?: Total Help needed climbing 3-5 steps with a railing? : Total 6 Click Score: 8    End of Session Equipment Utilized During Treatment: Gait belt Activity Tolerance: Patient limited by fatigue Patient left: in chair;with call bell/phone within reach;with chair alarm set Nurse Communication: Mobility status PT Visit Diagnosis: Unsteadiness on feet (R26.81);Other abnormalities of gait and mobility (R26.89);Muscle weakness (generalized) (M62.81);Difficulty in walking, not elsewhere classified (R26.2)     Time: 6962-9528 PT Time Calculation (min) (ACUTE ONLY): 24 min  Charges:    $Therapeutic Exercise: 8-22 mins $Therapeutic Activity: 8-22 mins PT General Charges $$ ACUTE PT VISIT: 1 Visit                     Jaleya Pebley, PT, GCS 12/30/22,12:12 PM

## 2022-12-31 DIAGNOSIS — A419 Sepsis, unspecified organism: Secondary | ICD-10-CM | POA: Diagnosis not present

## 2022-12-31 LAB — BASIC METABOLIC PANEL
Anion gap: 6 (ref 5–15)
BUN: 32 mg/dL — ABNORMAL HIGH (ref 8–23)
CO2: 31 mmol/L (ref 22–32)
Calcium: 9.3 mg/dL (ref 8.9–10.3)
Chloride: 107 mmol/L (ref 98–111)
Creatinine, Ser: 1.01 mg/dL (ref 0.61–1.24)
GFR, Estimated: >60 mL/min (ref >60)
Glucose, Bld: 103 mg/dL — ABNORMAL HIGH (ref 70–99)
Potassium: 4 mmol/L (ref 3.5–5.1)
Sodium: 144 mmol/L (ref 135–145)

## 2022-12-31 LAB — GLUCOSE, CAPILLARY
Glucose-Capillary: 98 mg/dL (ref 70–99)
Glucose-Capillary: 119 mg/dL — ABNORMAL HIGH (ref 70–99)
Glucose-Capillary: 93 mg/dL (ref 70–99)
Glucose-Capillary: 107 mg/dL — ABNORMAL HIGH (ref 70–99)

## 2022-12-31 LAB — CBC
HCT: 39.8 % (ref 39.0–52.0)
Hemoglobin: 13.5 g/dL (ref 13.0–17.0)
MCH: 32.1 pg (ref 26.0–34.0)
MCHC: 33.9 g/dL (ref 30.0–36.0)
MCV: 94.8 fL (ref 80.0–100.0)
Platelets: 166 10*3/uL (ref 150–400)
RBC: 4.2 MIL/uL — ABNORMAL LOW (ref 4.22–5.81)
RDW: 12.3 % (ref 11.5–15.5)
WBC: 7.7 10*3/uL (ref 4.0–10.5)
nRBC: 0 % (ref 0.0–0.2)

## 2022-12-31 NOTE — Care Management Important Message (Signed)
Important Message  Patient Details  Name: MAGNUS AURICH MRN: [id number] Date of Birth: 1929/06/13   Medicare Important Message Given:  Yes     Olegario Messier A Falen Lehrmann 12/31/2022, 11:40 AM

## 2022-12-31 NOTE — Progress Notes (Signed)
Occupational Therapy Treatment Patient Details Name: Adam [name] MRN: [id number] DOB: 12-03-1929 Today's Date: 12/31/2022   History of present illness [name] is a 89 year old male with history of dementia, hyperlipidemia, hypertension, GERD, who presents to the emergency department for chief concerns of altered mental status after found wandering outside of house by neighbour. Admitted for sepsis   OT comments  Mr Adam [name] was seen for OT treatment on this date. Upon arrival to room pt reclined in bed, agreeable to tx. Pt requires MAX A x2 bed mobility. Tolerated x3 standing trials with MAX A x2, limited by cognition and difficulty maintaining L foot on floor to stand upright. Noted to be soiled and returned to bed. MAX A bed level toileting. Pt making progress toward goals, will continue to follow POC. Discharge recommendation remains appropriate.        If plan is discharge home, recommend the following:  Two people to help with walking and/or transfers;Two people to help with bathing/dressing/bathroom;Supervision due to cognitive status   Equipment Recommendations  Other (comment) (defer)    Recommendations for Other Services      Precautions / Restrictions Precautions Precautions: Fall Restrictions Weight Bearing Restrictions: No       Mobility Bed Mobility Overal bed mobility: Needs Assistance Bed Mobility: Rolling, Supine to Sit, Sit to Supine Rolling: Max assist   Supine to sit: Max assist, +2 for physical assistance Sit to supine: Max assist        Transfers Overall transfer level: Needs assistance Equipment used: Rolling walker (2 wheels) Transfers: Sit to/from Stand Sit to Stand: Max assist, +2 physical assistance                 Balance Overall balance assessment: Needs assistance Sitting-balance support: Feet supported Sitting balance-Leahy Scale: Fair     Standing balance support: Bilateral upper extremity supported Standing  balance-Leahy Scale: Poor                             ADL either performed or assessed with clinical judgement   ADL Overall ADL's : Needs assistance/impaired                                       General ADL Comments: MAX A bed level toileting. MOD A don/doff gown in sitting      Cognition Arousal: Alert Behavior During Therapy: WFL for tasks assessed/performed Overall Cognitive Status: Impaired/Different from baseline Area of Impairment: Orientation, Following commands, Safety/judgement                 Orientation Level: Disoriented to, Place, Time     Following Commands: Follows one step commands consistently Safety/Judgement: Decreased awareness of safety                         Pertinent Vitals/ Pain       Pain Assessment Pain Assessment: Faces Faces Pain Scale: Hurts little more Pain Location: L hamstring/calf Pain Descriptors / Indicators: Discomfort, Grimacing Pain Intervention(s): Limited activity within patient's tolerance, Repositioned   Frequency  Min 1X/week        Progress Toward Goals  OT Goals(current goals can now be found in the care plan section)  Progress towards OT goals: Progressing toward goals  Acute Rehab OT Goals Patient Stated Goal: to walk OT Goal Formulation: With family  Time For Goal Achievement: 01/12/23 Potential to Achieve Goals: Good ADL Goals Pt Will Perform Grooming: standing;with min assist Pt Will Perform Lower Body Dressing: with min assist;sit to/from stand Pt Will Transfer to Toilet: with contact guard assist;stand pivot transfer;bedside commode  Plan      Co-evaluation                 AM-PAC OT "6 Clicks" Daily Activity     Outcome Measure   Help from another person eating meals?: A Little Help from another person taking care of personal grooming?: A Little Help from another person toileting, which includes using toliet, bedpan, or urinal?: A Lot Help from  another person bathing (including washing, rinsing, drying)?: A Lot Help from another person to put on and taking off regular upper body clothing?: A Little Help from another person to put on and taking off regular lower body clothing?: A Lot 6 Click Score: 15    End of Session    OT Visit Diagnosis: Other abnormalities of gait and mobility (R26.89);Muscle weakness (generalized) (M62.81)   Activity Tolerance Patient tolerated treatment well   Patient Left in bed;with call bell/phone within reach;with bed alarm set   Nurse Communication          Time: 4098-1191 OT Time Calculation (min): 28 min  Charges: OT General Charges $OT Visit: 1 Visit OT Treatments $Self Care/Home Management : 23-37 mins  Kathie Dike, M.S. OTR/L  12/31/22, 1:06 PM  ascom [phone]

## 2022-12-31 NOTE — Progress Notes (Addendum)
Progress Note    [name]  ZOX:[id number] DOB: May 21, 1929  DOA: 12/24/2022 PCP: Marina Goodell, MD      Brief Narrative:    Medical records reviewed and are as summarized below:  Adam [name] is a 89 y.o. male with dementia, hyperlipidemia, hypertension, GERD, who presented to the hospital because of altered mental status.  Reportedly, patient was found wandering in the woods by his neighbor.  He said he had been under a lot of stress since his wife passed away.  He was admitted to the hospital for sepsis possibly due to aspiration pneumonia and UTI.     Assessment/Plan:   Principal Problem:   Sepsis (HCC) Active Problems:   Essential hypertension   Gastroesophageal reflux disease without esophagitis   Primary osteoarthritis of left knee   Primary osteoarthritis of right knee   Pure hypercholesterolemia   Type 2 diabetes mellitus with stage 3 chronic kidney disease, without long-term current use of insulin (HCC)   AMS (altered mental status)   Elevated serum creatinine   Candida rash of groin   Palliative care encounter   Body mass index is 22.34 kg/m.   Sepsis secondary to UTI and aspiration pneumonia: Completed antibiotics. Staph hominis bacteremia: 2 out of 4 blood culture positive for Staph hominis.  This is thought to be a contaminant.   Acute metabolic encephalopathy versus delirium on underlying dementia: Patient appears to be at baseline.   Dysphagia: Continue dysphagia 1 diet as tolerated   Urinary retention: Foley catheter placed on 12/25/2022.  Remove Foley catheter and start voiding trial. Hematuria: Resolved.  This was attributed to pulling on catheter tube.   AKI, hypokalemia: Resolved   Other comorbidities include type II DM, hypertension, hyperlipidemia    Diet Order             DIET - DYS 1 Room service appropriate? Yes; Fluid consistency: Thin  Diet effective now                             Consultants: Palliative care  Procedures: None    Medications:    Chlorhexidine Gluconate Cloth  6 each Topical Daily   heparin  5,000 Units Subcutaneous Q8H   influenza vaccine adjuvanted  0.5 mL Intramuscular Tomorrow-1000   insulin aspart  0-5 Units Subcutaneous QHS   insulin aspart  0-9 Units Subcutaneous TID WC   pantoprazole (PROTONIX) IV  40 mg Intravenous Q24H   scopolamine  1 patch Transdermal Q72H   Continuous Infusions:   Anti-infectives (From admission, onward)    Start     Dose/Rate Route Frequency Ordered Stop   12/27/22 2200  azithromycin (ZITHROMAX) tablet 500 mg  Status:  Discontinued        500 mg Oral Daily at bedtime 12/27/22 1204 12/27/22 1205   12/27/22 2200  azithromycin (ZITHROMAX) 500 mg in sodium chloride 0.9 % 250 mL IVPB        500 mg 250 mL/hr over 60 Minutes Intravenous Daily at bedtime 12/27/22 1215 12/28/22 2311   12/25/22 1800  ceFEPIme (MAXIPIME) 2 g in sodium chloride 0.9 % 100 mL IVPB  Status:  Discontinued        2 g 200 mL/hr over 30 Minutes Intravenous  Once 12/24/22 1850 12/24/22 1851   12/25/22 1800  ceFEPIme (MAXIPIME) 2 g in sodium chloride 0.9 % 100 mL IVPB  Status:  Discontinued  2 g 200 mL/hr over 30 Minutes Intravenous Every 24 hours 12/24/22 1851 12/25/22 0822   12/25/22 0830  cefTRIAXone (ROCEPHIN) 1 g in sodium chloride 0.9 % 100 mL IVPB  Status:  Discontinued        1 g 200 mL/hr over 30 Minutes Intravenous Every 24 hours 12/25/22 0823 12/30/22 1408   12/25/22 0600  metroNIDAZOLE (FLAGYL) IVPB 500 mg  Status:  Discontinued        500 mg 100 mL/hr over 60 Minutes Intravenous Every 12 hours 12/24/22 1826 12/25/22 0823   12/24/22 2315  azithromycin (ZITHROMAX) 500 mg in sodium chloride 0.9 % 250 mL IVPB  Status:  Discontinued        500 mg 250 mL/hr over 60 Minutes Intravenous Every 24 hours 12/24/22 2303 12/27/22 1204   12/24/22 2000  vancomycin (VANCOREADY) IVPB 1750 mg/350 mL        1,750  mg 175 mL/hr over 120 Minutes Intravenous  Once 12/24/22 1840 12/24/22 2327   12/24/22 1800  ceFEPIme (MAXIPIME) 2 g in sodium chloride 0.9 % 100 mL IVPB        2 g 200 mL/hr over 30 Minutes Intravenous  Once 12/24/22 1749 12/24/22 1835   12/24/22 1745  aztreonam (AZACTAM) 2 g in sodium chloride 0.9 % 100 mL IVPB  Status:  Discontinued        2 g 200 mL/hr over 30 Minutes Intravenous  Once 12/24/22 1739 12/24/22 1748   12/24/22 1745  metroNIDAZOLE (FLAGYL) IVPB 500 mg        500 mg 100 mL/hr over 60 Minutes Intravenous  Once 12/24/22 1739 12/24/22 1923   12/24/22 1745  vancomycin (VANCOCIN) IVPB 1000 mg/200 mL premix  Status:  Discontinued        1,000 mg 200 mL/hr over 60 Minutes Intravenous  Once 12/24/22 1739 12/24/22 1840              Family Communication/Anticipated D/C date and plan/Code Status   DVT prophylaxis: heparin injection 5,000 Units Start: 12/24/22 2200 Place TED hose Start: 12/24/22 1827     Code Status: Do not attempt resuscitation (DNR) - Comfort care  Family Communication: None Disposition Plan: Plan to try to SNF   Status is: Inpatient Remains inpatient appropriate because: Awaiting placement to SNF       Subjective:   Interval events noted.  He is confused and cannot provide any history.  Objective:    Vitals:   12/30/22 1500 12/30/22 2041 12/31/22 0527 12/31/22 0742  BP: (!) 142/67 (!) 162/94 111/62 124/87  Pulse: 70 94 71 84  Resp: 16 17 18 19   Temp: 97.9 F (36.6 C) (!) 97.4 F (36.3 C) (!) 97.5 F (36.4 C) (!) 97.5 F (36.4 C)  TempSrc: Oral   Oral  SpO2:  100% 95% 97%  Weight:      Height:       No data found.  No intake or output data in the 24 hours ending 12/31/22 1222 Filed Weights   12/24/22 0900 12/26/22 0423 12/26/22 2046  Weight: 68 kg 64.6 kg 64.7 kg    Exam:  GEN: NAD SKIN: Warm and dry EYES: No pallor or icterus ENT: MMM CV: RRR PULM: CTA B ABD: soft, ND, NT, +BS CNS: AAO x 1, non focal EXT: No  edema or tenderness GU: Foley catheter draining amber urine       Data Reviewed:   I have personally reviewed following labs and imaging studies:  Labs: Labs show  the following:   Basic Metabolic Panel: Recent Labs  Lab 12/27/22 0415 12/28/22 0420 12/29/22 0524 12/30/22 0407 12/31/22 0515  NA 139 139 138 141 144  K 3.3* 3.4* 4.1 3.5 4.0  CL 103 101 103 104 107  CO2 27 26 27 29 31   GLUCOSE 86 110* 88 112* 103*  BUN 30* 32* 29* 39* 32*  CREATININE 1.08 1.01 0.89 0.98 1.01  CALCIUM 8.8* 9.1 8.9 9.0 9.3   GFR Estimated Creatinine Clearance: 43.6 mL/min (by C-G formula based on SCr of 1.01 mg/dL). Liver Function Tests: No results for input(s): "AST", "ALT", "ALKPHOS", "BILITOT", "PROT", "ALBUMIN" in the last 168 hours. No results for input(s): "LIPASE", "AMYLASE" in the last 168 hours. No results for input(s): "AMMONIA" in the last 168 hours. Coagulation profile No results for input(s): "INR", "PROTIME" in the last 168 hours.  CBC: Recent Labs  Lab 12/27/22 0415 12/28/22 0420 12/29/22 0524 12/30/22 0407 12/31/22 0515  WBC 10.0 9.0 8.0 6.9 7.7  HGB 12.5* 14.0 13.7 13.2 13.5  HCT 36.6* 41.2 41.3 39.0 39.8  MCV 93.6 93.6 95.4 94.7 94.8  PLT 145* 161 144* 152 166   Cardiac Enzymes: No results for input(s): "CKTOTAL", "CKMB", "CKMBINDEX", "TROPONINI" in the last 168 hours. BNP (last 3 results) No results for input(s): "PROBNP" in the last 8760 hours. CBG: Recent Labs  Lab 12/30/22 1133 12/30/22 1758 12/30/22 2045 12/31/22 0745 12/31/22 1208  GLUCAP 121* 110* 172* 98 119*   D-Dimer: No results for input(s): "DDIMER" in the last 72 hours. Hgb A1c: No results for input(s): "HGBA1C" in the last 72 hours. Lipid Profile: No results for input(s): "CHOL", "HDL", "LDLCALC", "TRIG", "CHOLHDL", "LDLDIRECT" in the last 72 hours. Thyroid function studies: No results for input(s): "TSH", "T4TOTAL", "T3FREE", "THYROIDAB" in the last 72 hours.  Invalid input(s):  "FREET3" Anemia work up: No results for input(s): "VITAMINB12", "FOLATE", "FERRITIN", "TIBC", "IRON", "RETICCTPCT" in the last 72 hours. Sepsis Labs: Recent Labs  Lab 12/25/22 0453 12/26/22 0501 12/28/22 0420 12/29/22 0524 12/30/22 0407 12/31/22 0515  PROCALCITON 2.37  --   --   --   --   --   WBC 10.6*   < > 9.0 8.0 6.9 7.7   < > = values in this interval not displayed.    Microbiology Recent Results (from the past 240 hour(s))  SARS Coronavirus 2 by RT PCR (hospital order, performed in [facility] hospital lab) *cepheid single result test* Anterior Nasal Swab     Status: None   Collection Time: 12/24/22  2:06 PM   Specimen: Anterior Nasal Swab  Result Value Ref Range Status   SARS Coronavirus 2 by RT PCR NEGATIVE NEGATIVE Final    Comment: (NOTE) SARS-CoV-2 target nucleic acids are NOT DETECTED.  The SARS-CoV-2 RNA is generally detectable in upper and lower respiratory specimens during the acute phase of infection. The lowest concentration of SARS-CoV-2 viral copies this assay can detect is 250 copies / mL. A negative result does not preclude SARS-CoV-2 infection and should not be used as the sole basis for treatment or other patient management decisions.  A negative result may occur with improper specimen collection / handling, submission of specimen other than nasopharyngeal swab, presence of viral mutation(s) within the areas targeted by this assay, and inadequate number of viral copies (<250 copies / mL). A negative result must be combined with clinical observations, patient history, and epidemiological information.  Fact Sheet for Patients:   RoadLapTop.co.za  Fact Sheet for Healthcare Providers: http://kim-miller.com/  This test is not yet approved or  cleared by the [place] and has been authorized for detection and/or diagnosis of SARS-CoV-2 by FDA under an Emergency Use Authorization (EUA).  This EUA will  remain in effect (meaning this test can be used) for the duration of the COVID-19 declaration under Section 564(b)(1) of the Act, 21 U.S.C. section 360bbb-3(b)(1), unless the authorization is terminated or revoked sooner.  Performed at [facility], [address] [place], [place] [address]   Blood culture (routine x 2)     Status: None   Collection Time: 12/24/22  2:06 PM   Specimen: BLOOD  Result Value Ref Range Status   Specimen Description BLOOD BLOOD LEFT FOREARM  Final   Special Requests   Final    BOTTLES DRAWN AEROBIC AND ANAEROBIC Blood Culture results may not be optimal due to an inadequate volume of blood received in culture bottles   Culture   Final    NO GROWTH 5 DAYS Performed at [facility], [address] [place], [place] [address]    Report Status 12/29/2022 FINAL  Final  Blood culture (routine x 2)     Status: Abnormal   Collection Time: 12/24/22  2:07 PM   Specimen: BLOOD  Result Value Ref Range Status   Specimen Description   Final    BLOOD BLOOD RIGHT FOREARM Performed at [facility], [address] [place], [place] [address]    Special Requests   Final    BOTTLES DRAWN AEROBIC AND ANAEROBIC Blood Culture results may not be optimal due to an inadequate volume of blood received in culture bottles Performed at [facility], [address] [place], [place] [address]    Culture  Setup Time   Final    GRAM POSITIVE COCCI IN BOTH AEROBIC AND ANAEROBIC BOTTLES Organism ID to follow CRITICAL RESULT CALLED TO, READ BACK BY AND VERIFIED WITH: [email or web address]  12/25/22 RH Performed at [facility], [address] [place], [place] [address]    Culture (A)  Final    STAPHYLOCOCCUS HOMINIS THE SIGNIFICANCE OF ISOLATING THIS ORGANISM FROM A SINGLE SET OF BLOOD CULTURES WHEN MULTIPLE SETS ARE DRAWN IS UNCERTAIN. PLEASE NOTIFY THE MICROBIOLOGY DEPARTMENT WITHIN ONE WEEK IF SPECIATION AND SENSITIVITIES ARE  REQUIRED. Performed at [facility], [address], [place], [place] [address]    Report Status 12/27/2022 FINAL  Final  Blood Culture ID Panel (Reflexed)     Status: Abnormal   Collection Time: 12/24/22  2:07 PM  Result Value Ref Range Status   Enterococcus faecalis NOT DETECTED NOT DETECTED Final   Enterococcus Faecium NOT DETECTED NOT DETECTED Final   Listeria monocytogenes NOT DETECTED NOT DETECTED Final   Staphylococcus species DETECTED (A) NOT DETECTED Final    Comment: CRITICAL RESULT CALLED TO, READ BACK BY AND VERIFIED WITH: Harrold Donath [id number] 12/25/22 RH    Staphylococcus aureus (BCID) NOT DETECTED NOT DETECTED Final   Staphylococcus epidermidis NOT DETECTED NOT DETECTED Final   Staphylococcus lugdunensis NOT DETECTED NOT DETECTED Final   Streptococcus species NOT DETECTED NOT DETECTED Final   Streptococcus agalactiae NOT DETECTED NOT DETECTED Final   Streptococcus pneumoniae NOT DETECTED NOT DETECTED Final   Streptococcus pyogenes NOT DETECTED NOT DETECTED Final   A.calcoaceticus-baumannii NOT DETECTED NOT DETECTED Final   Bacteroides fragilis NOT DETECTED NOT DETECTED Final   Enterobacterales NOT DETECTED NOT DETECTED Final   Enterobacter cloacae complex NOT DETECTED NOT DETECTED Final   Escherichia coli NOT  DETECTED NOT DETECTED Final   Klebsiella aerogenes NOT DETECTED NOT DETECTED Final   Klebsiella oxytoca NOT DETECTED NOT DETECTED Final   Klebsiella pneumoniae NOT DETECTED NOT DETECTED Final   Proteus species NOT DETECTED NOT DETECTED Final   Salmonella species NOT DETECTED NOT DETECTED Final   Serratia marcescens NOT DETECTED NOT DETECTED Final   Haemophilus influenzae NOT DETECTED NOT DETECTED Final   Neisseria meningitidis NOT DETECTED NOT DETECTED Final   Pseudomonas aeruginosa NOT DETECTED NOT DETECTED Final   Stenotrophomonas maltophilia NOT DETECTED NOT DETECTED Final   Candida albicans NOT DETECTED NOT DETECTED Final   Candida auris NOT DETECTED NOT  DETECTED Final   Candida glabrata NOT DETECTED NOT DETECTED Final   Candida krusei NOT DETECTED NOT DETECTED Final   Candida parapsilosis NOT DETECTED NOT DETECTED Final   Candida tropicalis NOT DETECTED NOT DETECTED Final   Cryptococcus neoformans/gattii NOT DETECTED NOT DETECTED Final    Comment: Performed at [facility], [address] [place], [place] [address]  Culture, blood (Routine X 2) w Reflex to ID Panel     Status: None   Collection Time: 12/25/22  9:19 AM   Specimen: BLOOD LEFT HAND  Result Value Ref Range Status   Specimen Description BLOOD LEFT HAND  Final   Special Requests   Final    BOTTLES DRAWN AEROBIC AND ANAEROBIC Blood Culture adequate volume   Culture   Final    NO GROWTH 5 DAYS Performed at [facility], [address] [place], [place] [address]    Report Status 12/30/2022 FINAL  Final  Culture, blood (Routine X 2) w Reflex to ID Panel     Status: None   Collection Time: 12/25/22  9:27 AM   Specimen: BLOOD LEFT HAND  Result Value Ref Range Status   Specimen Description BLOOD LEFT HAND  Final   Special Requests   Final    BOTTLES DRAWN AEROBIC AND ANAEROBIC Blood Culture adequate volume   Culture   Final    NO GROWTH 5 DAYS Performed at [facility], [address] [place], [place] [address]    Report Status 12/30/2022 FINAL  Final  Urine Culture (for pregnant, neutropenic or urologic patients or patients with an indwelling urinary catheter)     Status: None   Collection Time: 12/25/22 10:29 AM   Specimen: In/Out Cath Urine  Result Value Ref Range Status   Specimen Description   Final    IN/OUT CATH URINE Performed at [facility], [address], [place], [place] [address]    Special Requests   Final    NONE Performed at [facility], [address], [place], [place] [address]    Culture   Final    NO GROWTH Performed at [facility], [address] [place], [place] [address]    Report  Status 12/26/2022 FINAL  Final    Procedures and diagnostic studies:  No results found.             LOS: 7 days   Hendricks Schwandt  [facility]   Pager on [email or web address]. If 7PM-7AM, please contact night-coverage at [email or web address]     12/31/2022, 12:22 PM

## 2023-01-01 DIAGNOSIS — A419 Sepsis, unspecified organism: Secondary | ICD-10-CM | POA: Diagnosis not present

## 2023-01-01 LAB — GLUCOSE, CAPILLARY
Glucose-Capillary: 132 mg/dL — ABNORMAL HIGH (ref 70–99)
Glucose-Capillary: 122 mg/dL — ABNORMAL HIGH (ref 70–99)
Glucose-Capillary: 125 mg/dL — ABNORMAL HIGH (ref 70–99)
Glucose-Capillary: 120 mg/dL — ABNORMAL HIGH (ref 70–99)

## 2023-01-01 NOTE — Progress Notes (Signed)
Physical Therapy Treatment Patient Details Name: Adam [name] MRN: [id number] DOB: 03-Apr-1930 Today's Date: 01/01/2023   History of Present Illness [name] is a 89 year old male with history of dementia, hyperlipidemia, hypertension, GERD, who presents to the emergency department for chief concerns of altered mental status after found wandering outside of house by neighbour. Admitted for sepsis    PT Comments  Pt was long sitting in bed with untouched lunch tray at bedside. Pt is alert but disoriented." I'm at home right?"  Pt remained pleasantly confused throughout. He was able to exit R side of bed, stand and pivot to/from recliner with max assist. Highly recommend +2 for all future transfers. Pt is severely limited by fatigue. Author assisted with hygiene care after BM and notified RN staff about dark urine concerns. Pt needed assistance to eat and drink. Will need assistance with all meals going forward. Acute PT will continue to follow and progress per current POC.     If plan is discharge home, recommend the following: A lot of help with walking and/or transfers;A lot of help with bathing/dressing/bathroom;Assistance with cooking/housework;Assistance with feeding;Direct supervision/assist for medications management;Direct supervision/assist for financial management;Assist for transportation;Help with stairs or ramp for entrance     Equipment Recommendations  None recommended by PT       Precautions / Restrictions Precautions Precautions: Fall Restrictions Weight Bearing Restrictions: No     Mobility  Bed Mobility Overal bed mobility: Needs Assistance Bed Mobility: Rolling, Supine to Sit, Sit to Supine Rolling: Max assist Supine to sit: Max assist, Used rails Sit to supine: Max assist   Transfers Overall transfer level: Needs assistance Equipment used: Rolling walker (2 wheels) Transfers: Sit to/from Stand Sit to Stand: Max assist, Total assist Stand pivot  transfers: Max assist  General transfer comment: pt performed transfer from EOB to recliner and then back to bed.    Ambulation/Gait  General Gait Details: currently unable    Balance Overall balance assessment: Needs assistance Sitting-balance support: Feet supported Sitting balance-Leahy Scale: Fair  Standing balance support: Bilateral upper extremity supported Standing balance-Leahy Scale: Poor     Cognition Arousal: Alert Behavior During Therapy: Flat affect Overall Cognitive Status: Impaired/Different from baseline    Following Commands: Follows one step commands consistently Safety/Judgement: Decreased awareness of safety   Problem Solving: Slow processing, Requires verbal cues, Requires tactile cues, Decreased initiation, Difficulty sequencing                 Pertinent Vitals/Pain Pain Assessment Pain Assessment: PAINAD Breathing: occasional labored breathing, short period of hyperventilation Negative Vocalization: occasional moan/groan, low speech, negative/disapproving quality Facial Expression: smiling or inexpressive Body Language: relaxed Consolability: no need to console PAINAD Score: 2 Pain Location: L hamstring/calf Pain Descriptors / Indicators: Discomfort, Grimacing Pain Intervention(s): Limited activity within patient's tolerance, Monitored during session, Premedicated before session, Repositioned     PT Goals (current goals can now be found in the care plan section) Acute Rehab PT Goals Patient Stated Goal: none stated Progress towards PT goals: Progressing toward goals    Frequency    Min 1X/week       AM-PAC PT "6 Clicks" Mobility   Outcome Measure  Help needed turning from your back to your side while in a flat bed without using bedrails?: A Lot Help needed moving from lying on your back to sitting on the side of a flat bed without using bedrails?: A Lot         6 Click Score: 4  End of Session   Activity Tolerance:  Patient limited by fatigue Patient left: in bed;with call bell/phone within reach;with bed alarm set Nurse Communication: Mobility status PT Visit Diagnosis: Unsteadiness on feet (R26.81);Other abnormalities of gait and mobility (R26.89);Muscle weakness (generalized) (M62.81);Difficulty in walking, not elsewhere classified (R26.2)     Time: 1610-9604 PT Time Calculation (min) (ACUTE ONLY): 15 min  Charges:    $Therapeutic Activity: 8-22 mins PT General Charges $$ ACUTE PT VISIT: 1 Visit                    Jetta Lout PTA 01/01/23, 4:48 PM

## 2023-01-01 NOTE — Progress Notes (Signed)
Progress Note    Adam [name]  ION:[id number] DOB: 03/01/1930  DOA: 12/24/2022 PCP: Marina Goodell, MD      Brief Narrative:    Medical records reviewed and are as summarized below:  Adam [name] is a 89 y.o. male with dementia, hyperlipidemia, hypertension, GERD, who presented to the hospital because of altered mental status.  Reportedly, patient was found wandering in the woods by his neighbor.  He said he had been under a lot of stress since his wife passed away.  He was admitted to the hospital for sepsis possibly due to aspiration pneumonia and UTI.     Assessment/Plan:   Principal Problem:   Sepsis (HCC) Active Problems:   Essential hypertension   Gastroesophageal reflux disease without esophagitis   Primary osteoarthritis of left knee   Primary osteoarthritis of right knee   Pure hypercholesterolemia   Type 2 diabetes mellitus with stage 3 chronic kidney disease, without long-term current use of insulin (HCC)   AMS (altered mental status)   Elevated serum creatinine   Candida rash of groin   Palliative care encounter   Body mass index is 22.34 kg/m.   Sepsis secondary to UTI and aspiration pneumonia: Completed antibiotics. Staph hominis bacteremia: 2 out of 4 blood culture positive for Staph hominis.  This is thought to be a contaminant.   Acute metabolic encephalopathy versus delirium on underlying dementia: Patient appears to be at baseline.   Dysphagia: Continue dysphagia 1 diet as tolerated   Urinary retention: Foley catheter was initially placed on 12/25/2022.  It was removed on 12/31/2022.  Unfortunately, patient failed voiding trial. Foley catheter was replaced on 01/01/2023.  Hematuria: Resolved.  This was attributed to pulling on catheter tube.   AKI, hypokalemia: Resolved   Other comorbidities include type II DM, hypertension, hyperlipidemia    Diet Order             DIET - DYS 1 Room service appropriate? Yes; Fluid  consistency: Thin  Diet effective now                            Consultants: Palliative care  Procedures: None    Medications:    Chlorhexidine Gluconate Cloth  6 each Topical Daily   heparin  5,000 Units Subcutaneous Q8H   influenza vaccine adjuvanted  0.5 mL Intramuscular Tomorrow-1000   insulin aspart  0-5 Units Subcutaneous QHS   insulin aspart  0-9 Units Subcutaneous TID WC   pantoprazole (PROTONIX) IV  40 mg Intravenous Q24H   scopolamine  1 patch Transdermal Q72H   Continuous Infusions:   Anti-infectives (From admission, onward)    Start     Dose/Rate Route Frequency Ordered Stop   12/27/22 2200  azithromycin (ZITHROMAX) tablet 500 mg  Status:  Discontinued        500 mg Oral Daily at bedtime 12/27/22 1204 12/27/22 1205   12/27/22 2200  azithromycin (ZITHROMAX) 500 mg in sodium chloride 0.9 % 250 mL IVPB        500 mg 250 mL/hr over 60 Minutes Intravenous Daily at bedtime 12/27/22 1215 12/28/22 2311   12/25/22 1800  ceFEPIme (MAXIPIME) 2 g in sodium chloride 0.9 % 100 mL IVPB  Status:  Discontinued        2 g 200 mL/hr over 30 Minutes Intravenous  Once 12/24/22 1850 12/24/22 1851   12/25/22 1800  ceFEPIme (MAXIPIME) 2 g in sodium chloride 0.9 %  100 mL IVPB  Status:  Discontinued        2 g 200 mL/hr over 30 Minutes Intravenous Every 24 hours 12/24/22 1851 12/25/22 0822   12/25/22 0830  cefTRIAXone (ROCEPHIN) 1 g in sodium chloride 0.9 % 100 mL IVPB  Status:  Discontinued        1 g 200 mL/hr over 30 Minutes Intravenous Every 24 hours 12/25/22 0823 12/30/22 1408   12/25/22 0600  metroNIDAZOLE (FLAGYL) IVPB 500 mg  Status:  Discontinued        500 mg 100 mL/hr over 60 Minutes Intravenous Every 12 hours 12/24/22 1826 12/25/22 0823   12/24/22 2315  azithromycin (ZITHROMAX) 500 mg in sodium chloride 0.9 % 250 mL IVPB  Status:  Discontinued        500 mg 250 mL/hr over 60 Minutes Intravenous Every 24 hours 12/24/22 2303 12/27/22 1204   12/24/22 2000   vancomycin (VANCOREADY) IVPB 1750 mg/350 mL        1,750 mg 175 mL/hr over 120 Minutes Intravenous  Once 12/24/22 1840 12/24/22 2327   12/24/22 1800  ceFEPIme (MAXIPIME) 2 g in sodium chloride 0.9 % 100 mL IVPB        2 g 200 mL/hr over 30 Minutes Intravenous  Once 12/24/22 1749 12/24/22 1835   12/24/22 1745  aztreonam (AZACTAM) 2 g in sodium chloride 0.9 % 100 mL IVPB  Status:  Discontinued        2 g 200 mL/hr over 30 Minutes Intravenous  Once 12/24/22 1739 12/24/22 1748   12/24/22 1745  metroNIDAZOLE (FLAGYL) IVPB 500 mg        500 mg 100 mL/hr over 60 Minutes Intravenous  Once 12/24/22 1739 12/24/22 1923   12/24/22 1745  vancomycin (VANCOCIN) IVPB 1000 mg/200 mL premix  Status:  Discontinued        1,000 mg 200 mL/hr over 60 Minutes Intravenous  Once 12/24/22 1739 12/24/22 1840              Family Communication/Anticipated D/C date and plan/Code Status   DVT prophylaxis: heparin injection 5,000 Units Start: 12/24/22 2200 Place TED hose Start: 12/24/22 1827     Code Status: Do not attempt resuscitation (DNR) - Comfort care  Family Communication: None Disposition Plan: Plan t to discharge to SNF   Status is: Inpatient Remains inpatient appropriate because: Awaiting placement to SNF       Subjective:   Interval events noted.  Foley catheter was removed yesterday but unfortunately patient has not been able to pass urine spontaneously since Foley catheter was removed.  Objective:    Vitals:   12/31/22 1534 12/31/22 2037 01/01/23 0528 01/01/23 0732  BP: (!) 125/57 (!) 140/80 136/82 (!) 142/70  Pulse: 77 82 85 92  Resp: 19 18 18 19   Temp: 98.7 F (37.1 C) 97.6 F (36.4 C) (!) 97.4 F (36.3 C) 98.5 F (36.9 C)  TempSrc:      SpO2: 95% 100% 100% 97%  Weight:      Height:       No data found.  No intake or output data in the 24 hours ending 01/01/23 1516 Filed Weights   12/24/22 0900 12/26/22 0423 12/26/22 2046  Weight: 68 kg 64.6 kg 64.7 kg     Exam:  GEN: NAD SKIN: Warm and dry EYES: No pallor or icterus ENT: MMM CV: RRR PULM: CTA B ABD: soft, ND, NT, +BS CNS: AAO x 1 (person), non focal EXT: No edema or tenderness  Data Reviewed:   I have personally reviewed following labs and imaging studies:  Labs: Labs show the following:   Basic Metabolic Panel: Recent Labs  Lab 12/27/22 0415 12/28/22 0420 12/29/22 0524 12/30/22 0407 12/31/22 0515  NA 139 139 138 141 144  K 3.3* 3.4* 4.1 3.5 4.0  CL 103 101 103 104 107  CO2 27 26 27 29 31   GLUCOSE 86 110* 88 112* 103*  BUN 30* 32* 29* 39* 32*  CREATININE 1.08 1.01 0.89 0.98 1.01  CALCIUM 8.8* 9.1 8.9 9.0 9.3   GFR Estimated Creatinine Clearance: 43.6 mL/min (by C-G formula based on SCr of 1.01 mg/dL). Liver Function Tests: No results for input(s): "AST", "ALT", "ALKPHOS", "BILITOT", "PROT", "ALBUMIN" in the last 168 hours. No results for input(s): "LIPASE", "AMYLASE" in the last 168 hours. No results for input(s): "AMMONIA" in the last 168 hours. Coagulation profile No results for input(s): "INR", "PROTIME" in the last 168 hours.  CBC: Recent Labs  Lab 12/27/22 0415 12/28/22 0420 12/29/22 0524 12/30/22 0407 12/31/22 0515  WBC 10.0 9.0 8.0 6.9 7.7  HGB 12.5* 14.0 13.7 13.2 13.5  HCT 36.6* 41.2 41.3 39.0 39.8  MCV 93.6 93.6 95.4 94.7 94.8  PLT 145* 161 144* 152 166   Cardiac Enzymes: No results for input(s): "CKTOTAL", "CKMB", "CKMBINDEX", "TROPONINI" in the last 168 hours. BNP (last 3 results) No results for input(s): "PROBNP" in the last 8760 hours. CBG: Recent Labs  Lab 12/31/22 1208 12/31/22 1537 12/31/22 2038 01/01/23 0729 01/01/23 1148  GLUCAP 119* 93 107* 132* 122*   D-Dimer: No results for input(s): "DDIMER" in the last 72 hours. Hgb A1c: No results for input(s): "HGBA1C" in the last 72 hours. Lipid Profile: No results for input(s): "CHOL", "HDL", "LDLCALC", "TRIG", "CHOLHDL", "LDLDIRECT" in the last 72 hours. Thyroid  function studies: No results for input(s): "TSH", "T4TOTAL", "T3FREE", "THYROIDAB" in the last 72 hours.  Invalid input(s): "FREET3" Anemia work up: No results for input(s): "VITAMINB12", "FOLATE", "FERRITIN", "TIBC", "IRON", "RETICCTPCT" in the last 72 hours. Sepsis Labs: Recent Labs  Lab 12/28/22 0420 12/29/22 0524 12/30/22 0407 12/31/22 0515  WBC 9.0 8.0 6.9 7.7    Microbiology Recent Results (from the past 240 hour(s))  SARS Coronavirus 2 by RT PCR (hospital order, performed in [facility] hospital lab) *cepheid single result test* Anterior Nasal Swab     Status: None   Collection Time: 12/24/22  2:06 PM   Specimen: Anterior Nasal Swab  Result Value Ref Range Status   SARS Coronavirus 2 by RT PCR NEGATIVE NEGATIVE Final    Comment: (NOTE) SARS-CoV-2 target nucleic acids are NOT DETECTED.  The SARS-CoV-2 RNA is generally detectable in upper and lower respiratory specimens during the acute phase of infection. The lowest concentration of SARS-CoV-2 viral copies this assay can detect is 250 copies / mL. A negative result does not preclude SARS-CoV-2 infection and should not be used as the sole basis for treatment or other patient management decisions.  A negative result may occur with improper specimen collection / handling, submission of specimen other than nasopharyngeal swab, presence of viral mutation(s) within the areas targeted by this assay, and inadequate number of viral copies (<250 copies / mL). A negative result must be combined with clinical observations, patient history, and epidemiological information.  Fact Sheet for Patients:   RoadLapTop.co.za  Fact Sheet for Healthcare Providers: http://kim-miller.com/  This test is not yet approved or  cleared by the [place] FDA and has been authorized for detection and/or  diagnosis of SARS-CoV-2 by FDA under an Emergency Use Authorization (EUA).  This EUA will  remain in effect (meaning this test can be used) for the duration of the COVID-19 declaration under Section 564(b)(1) of the Act, 21 U.S.C. section 360bbb-3(b)(1), unless the authorization is terminated or revoked sooner.  Performed at [facility], [address] [place], [place] [address]   Blood culture (routine x 2)     Status: None   Collection Time: 12/24/22  2:06 PM   Specimen: BLOOD  Result Value Ref Range Status   Specimen Description BLOOD BLOOD LEFT FOREARM  Final   Special Requests   Final    BOTTLES DRAWN AEROBIC AND ANAEROBIC Blood Culture results may not be optimal due to an inadequate volume of blood received in culture bottles   Culture   Final    NO GROWTH 5 DAYS Performed at [facility], [address] [place], [place] [address]    Report Status 12/29/2022 FINAL  Final  Blood culture (routine x 2)     Status: Abnormal   Collection Time: 12/24/22  2:07 PM   Specimen: BLOOD  Result Value Ref Range Status   Specimen Description   Final    BLOOD BLOOD RIGHT FOREARM Performed at [facility], [address] [place], [place] [address]    Special Requests   Final    BOTTLES DRAWN AEROBIC AND ANAEROBIC Blood Culture results may not be optimal due to an inadequate volume of blood received in culture bottles Performed at [facility], [address] [place], [place] [address]    Culture  Setup Time   Final    GRAM POSITIVE COCCI IN BOTH AEROBIC AND ANAEROBIC BOTTLES Organism ID to follow CRITICAL RESULT CALLED TO, READ BACK BY AND VERIFIED WITH: [email or web address]  12/25/22 RH Performed at [facility], [address] [place], [place] [address]    Culture (A)  Final    STAPHYLOCOCCUS HOMINIS THE SIGNIFICANCE OF ISOLATING THIS ORGANISM FROM A SINGLE SET OF BLOOD CULTURES WHEN MULTIPLE SETS ARE DRAWN IS UNCERTAIN. PLEASE NOTIFY THE MICROBIOLOGY DEPARTMENT WITHIN ONE WEEK IF SPECIATION AND SENSITIVITIES ARE  REQUIRED. Performed at [facility], [address], [place], [place] [address]    Report Status 12/27/2022 FINAL  Final  Blood Culture ID Panel (Reflexed)     Status: Abnormal   Collection Time: 12/24/22  2:07 PM  Result Value Ref Range Status   Enterococcus faecalis NOT DETECTED NOT DETECTED Final   Enterococcus Faecium NOT DETECTED NOT DETECTED Final   Listeria monocytogenes NOT DETECTED NOT DETECTED Final   Staphylococcus species DETECTED (A) NOT DETECTED Final    Comment: CRITICAL RESULT CALLED TO, READ BACK BY AND VERIFIED WITH: Harrold Donath [id number] 12/25/22 RH    Staphylococcus aureus (BCID) NOT DETECTED NOT DETECTED Final   Staphylococcus epidermidis NOT DETECTED NOT DETECTED Final   Staphylococcus lugdunensis NOT DETECTED NOT DETECTED Final   Streptococcus species NOT DETECTED NOT DETECTED Final   Streptococcus agalactiae NOT DETECTED NOT DETECTED Final   Streptococcus pneumoniae NOT DETECTED NOT DETECTED Final   Streptococcus pyogenes NOT DETECTED NOT DETECTED Final   A.calcoaceticus-baumannii NOT DETECTED NOT DETECTED Final   Bacteroides fragilis NOT DETECTED NOT DETECTED Final   Enterobacterales NOT DETECTED NOT DETECTED Final   Enterobacter cloacae complex NOT DETECTED NOT DETECTED Final   Escherichia coli NOT DETECTED NOT DETECTED Final   Klebsiella aerogenes NOT DETECTED NOT DETECTED Final   Klebsiella oxytoca NOT DETECTED NOT DETECTED  Final   Klebsiella pneumoniae NOT DETECTED NOT DETECTED Final   Proteus species NOT DETECTED NOT DETECTED Final   Salmonella species NOT DETECTED NOT DETECTED Final   Serratia marcescens NOT DETECTED NOT DETECTED Final   Haemophilus influenzae NOT DETECTED NOT DETECTED Final   Neisseria meningitidis NOT DETECTED NOT DETECTED Final   Pseudomonas aeruginosa NOT DETECTED NOT DETECTED Final   Stenotrophomonas maltophilia NOT DETECTED NOT DETECTED Final   Candida albicans NOT DETECTED NOT DETECTED Final   Candida auris NOT DETECTED NOT  DETECTED Final   Candida glabrata NOT DETECTED NOT DETECTED Final   Candida krusei NOT DETECTED NOT DETECTED Final   Candida parapsilosis NOT DETECTED NOT DETECTED Final   Candida tropicalis NOT DETECTED NOT DETECTED Final   Cryptococcus neoformans/gattii NOT DETECTED NOT DETECTED Final    Comment: Performed at [facility], [address] [place], [place] [address]  Culture, blood (Routine X 2) w Reflex to ID Panel     Status: None   Collection Time: 12/25/22  9:19 AM   Specimen: BLOOD LEFT HAND  Result Value Ref Range Status   Specimen Description BLOOD LEFT HAND  Final   Special Requests   Final    BOTTLES DRAWN AEROBIC AND ANAEROBIC Blood Culture adequate volume   Culture   Final    NO GROWTH 5 DAYS Performed at [facility], [address] [place], [place] [address]    Report Status 12/30/2022 FINAL  Final  Culture, blood (Routine X 2) w Reflex to ID Panel     Status: None   Collection Time: 12/25/22  9:27 AM   Specimen: BLOOD LEFT HAND  Result Value Ref Range Status   Specimen Description BLOOD LEFT HAND  Final   Special Requests   Final    BOTTLES DRAWN AEROBIC AND ANAEROBIC Blood Culture adequate volume   Culture   Final    NO GROWTH 5 DAYS Performed at [facility], [address] [place], [place] [address]    Report Status 12/30/2022 FINAL  Final  Urine Culture (for pregnant, neutropenic or urologic patients or patients with an indwelling urinary catheter)     Status: None   Collection Time: 12/25/22 10:29 AM   Specimen: In/Out Cath Urine  Result Value Ref Range Status   Specimen Description   Final    IN/OUT CATH URINE Performed at [facility], [address] [place], [place] [address]    Special Requests   Final    NONE Performed at [facility], [address] [place], [place] [address]    Culture   Final    NO GROWTH Performed at [facility], [address], [place], [place] [address]    Report  Status 12/26/2022 FINAL  Final    Procedures and diagnostic studies:  No results found.             LOS: 8 days   Tianne Plott  [facility]   Pager on [email or web address]. If 7PM-7AM, please contact night-coverage at [email or web address]     01/01/2023, 3:16 PM

## 2023-01-01 NOTE — Progress Notes (Signed)
       CROSS COVER NOTE  NAME: Adam [name] MRN: [id number] DOB : [birth year]    Concern as stated by nurse / staff   No urine output documented in over 24 hours Current bladde scan volume 428.      Pertinent findings on chart review: Attempt   Assessment and  Interventions   Assessment:  Plan: attempt in and out cath if unable to pass catheter attempt coude catheter placement       Donnie Mesa NP [facility] Cross Cover 7pm-7am - check amion for availability Pager [phone]

## 2023-01-01 NOTE — TOC Progression Note (Signed)
Transition of Care [facility]) - Progression Note    Patient Details  Name: Adam [name] MRN: [id number] Date of Birth: 1929/10/24  Transition of Care [facility] CM/SW Contact  Allena Katz, LCSW Phone Number: 01/01/2023, 2:30 PM  Clinical Narrative:  Son reports he would [facility] to [facility]. He wants to rehab him first and then transition to LTC.     Expected Discharge Plan: Skilled Nursing Facility Barriers to Discharge: Continued Medical Work up  Expected Discharge Plan and Services       Living arrangements for the past 2 months: Single Family Home                                       Social Determinants of Health (SDOH) Interventions SDOH Screenings   Food Insecurity: Patient Unable To Answer (12/25/2022)  Housing: Patient Unable To Answer (12/25/2022)  Transportation Needs: Patient Unable To Answer (12/25/2022)  Utilities: Patient Unable To Answer (12/25/2022)  Financial Resource Strain: Low Risk  (10/20/2022)   Received from [facility]  Tobacco Use: Medium Risk (12/24/2022)    Readmission Risk Interventions     No data to display

## 2023-01-02 DIAGNOSIS — A419 Sepsis, unspecified organism: Secondary | ICD-10-CM | POA: Diagnosis not present

## 2023-01-02 LAB — GLUCOSE, CAPILLARY
Glucose-Capillary: 111 mg/dL — ABNORMAL HIGH (ref 70–99)
Glucose-Capillary: 122 mg/dL — ABNORMAL HIGH (ref 70–99)
Glucose-Capillary: 114 mg/dL — ABNORMAL HIGH (ref 70–99)
Glucose-Capillary: 107 mg/dL — ABNORMAL HIGH (ref 70–99)

## 2023-01-02 NOTE — Plan of Care (Signed)
  Problem: Fluid Volume: Goal: Hemodynamic stability will improve Outcome: Progressing   Problem: Clinical Measurements: Goal: Diagnostic test results will improve Outcome: Progressing Goal: Signs and symptoms of infection will decrease Outcome: Progressing   Problem: Respiratory: Goal: Ability to maintain adequate ventilation will improve Outcome: Progressing   Problem: Education: Goal: Ability to describe self-care measures that may prevent or decrease complications (Diabetes Survival Skills Education) will improve Outcome: Progressing Goal: Individualized Educational Video(s) Outcome: Progressing   Problem: Coping: Goal: Ability to adjust to condition or change in health will improve Outcome: Progressing   Problem: Fluid Volume: Goal: Ability to maintain a balanced intake and output will improve Outcome: Progressing   Problem: Health Behavior/Discharge Planning: Goal: Ability to identify and utilize available resources and services will improve Outcome: Progressing Goal: Ability to manage health-related needs will improve Outcome: Progressing   Problem: Metabolic: Goal: Ability to maintain appropriate glucose levels will improve Outcome: Progressing   Problem: Nutritional: Goal: Maintenance of adequate nutrition will improve Outcome: Progressing Goal: Progress toward achieving an optimal weight will improve Outcome: Progressing   Problem: Skin Integrity: Goal: Risk for impaired skin integrity will decrease Outcome: Progressing   Problem: Tissue Perfusion: Goal: Adequacy of tissue perfusion will improve Outcome: Progressing   Problem: Education: Goal: Knowledge of General Education information will improve Description: Including pain rating scale, medication(s)/side effects and non-pharmacologic comfort measures Outcome: Progressing   Problem: Health Behavior/Discharge Planning: Goal: Ability to manage health-related needs will improve Outcome:  Progressing   Problem: Clinical Measurements: Goal: Ability to maintain clinical measurements within normal limits will improve Outcome: Progressing Goal: Will remain free from infection Outcome: Progressing Goal: Diagnostic test results will improve Outcome: Progressing Goal: Respiratory complications will improve Outcome: Progressing Goal: Cardiovascular complication will be avoided Outcome: Progressing   Problem: Activity: Goal: Risk for activity intolerance will decrease Outcome: Progressing   Problem: Nutrition: Goal: Adequate nutrition will be maintained Outcome: Progressing   Problem: Coping: Goal: Level of anxiety will decrease Outcome: Progressing   Problem: Elimination: Goal: Will not experience complications related to bowel motility Outcome: Progressing Goal: Will not experience complications related to urinary retention Outcome: Progressing   Problem: Pain Managment: Goal: General experience of comfort will improve Outcome: Progressing   Problem: Safety: Goal: Ability to remain free from injury will improve Outcome: Progressing   Problem: Skin Integrity: Goal: Risk for impaired skin integrity will decrease Outcome: Progressing   Problem: Safety: Goal: Non-violent Restraint(s) Outcome: Progressing

## 2023-01-02 NOTE — Progress Notes (Signed)
Progress Note    Adam [name]  ZOX:[id number] DOB: January 18, 1930  DOA: 12/24/2022 PCP: Adam Goodell, MD      Brief Narrative:    Medical records reviewed and are as summarized below:  Adam [name] is a 89 y.o. male with dementia, hyperlipidemia, hypertension, GERD, who presented to the hospital because of altered mental status.  Reportedly, patient was found wandering in the woods by his neighbor.  He said he had been under a lot of stress since his wife passed away.  He was admitted to the hospital for sepsis possibly due to aspiration pneumonia and UTI.     Assessment/Plan:   Principal Problem:   Sepsis (HCC) Active Problems:   Essential hypertension   Gastroesophageal reflux disease without esophagitis   Primary osteoarthritis of left knee   Primary osteoarthritis of right knee   Pure hypercholesterolemia   Type 2 diabetes mellitus with stage 3 chronic kidney disease, without long-term current use of insulin (HCC)   AMS (altered mental status)   Elevated serum creatinine   Candida rash of groin   Palliative care encounter   Body mass index is 22.34 kg/m.   Sepsis secondary to UTI and aspiration pneumonia: Completed antibiotics. Staph hominis bacteremia: 2 out of 4 blood culture positive for Staph hominis.  This is thought to be a contaminant.   Acute metabolic encephalopathy versus delirium on underlying dementia: Patient appears to be at baseline.   Dysphagia: Patient still has intermittent coughing spells after meals.  He is still at high risk for aspiration.  Speech therapist had recommended strict NPO.  However, his son wanted him to have dysphagia 1 diet for comfort.  Risk of aspiration was personally discussed with Mr. Adam, [name], son, over the phone.  He understands that patient is at high risk for aspiration but he will continue with current diet for now.   Urinary retention: Foley catheter was initially placed on 12/25/2022.  It was  removed on 12/31/2022.  Unfortunately, patient failed voiding trial. Foley catheter was replaced on 01/01/2023.  Hematuria: Resolved.  This was attributed to pulling on catheter tube.   AKI, hypokalemia: Resolved   Other comorbidities include type II DM, hypertension, hyperlipidemia   Awaiting placement to SNF.   Diet Order             DIET - DYS 1 Room service appropriate? Yes; Fluid consistency: Thin  Diet effective now                            Consultants: Palliative care  Procedures: None    Medications:    Chlorhexidine Gluconate Cloth  6 each Topical Daily   heparin  5,000 Units Subcutaneous Q8H   influenza vaccine adjuvanted  0.5 mL Intramuscular Tomorrow-1000   insulin aspart  0-5 Units Subcutaneous QHS   insulin aspart  0-9 Units Subcutaneous TID WC   pantoprazole (PROTONIX) IV  40 mg Intravenous Q24H   scopolamine  1 patch Transdermal Q72H   Continuous Infusions:   Anti-infectives (From admission, onward)    Start     Dose/Rate Route Frequency Ordered Stop   12/27/22 2200  azithromycin (ZITHROMAX) tablet 500 mg  Status:  Discontinued        500 mg Oral Daily at bedtime 12/27/22 1204 12/27/22 1205   12/27/22 2200  azithromycin (ZITHROMAX) 500 mg in sodium chloride 0.9 % 250 mL IVPB  500 mg 250 mL/hr over 60 Minutes Intravenous Daily at bedtime 12/27/22 1215 12/28/22 2311   12/25/22 1800  ceFEPIme (MAXIPIME) 2 g in sodium chloride 0.9 % 100 mL IVPB  Status:  Discontinued        2 g 200 mL/hr over 30 Minutes Intravenous  Once 12/24/22 1850 12/24/22 1851   12/25/22 1800  ceFEPIme (MAXIPIME) 2 g in sodium chloride 0.9 % 100 mL IVPB  Status:  Discontinued        2 g 200 mL/hr over 30 Minutes Intravenous Every 24 hours 12/24/22 1851 12/25/22 0822   12/25/22 0830  cefTRIAXone (ROCEPHIN) 1 g in sodium chloride 0.9 % 100 mL IVPB  Status:  Discontinued        1 g 200 mL/hr over 30 Minutes Intravenous Every 24 hours 12/25/22 0823 12/30/22  1408   12/25/22 0600  metroNIDAZOLE (FLAGYL) IVPB 500 mg  Status:  Discontinued        500 mg 100 mL/hr over 60 Minutes Intravenous Every 12 hours 12/24/22 1826 12/25/22 0823   12/24/22 2315  azithromycin (ZITHROMAX) 500 mg in sodium chloride 0.9 % 250 mL IVPB  Status:  Discontinued        500 mg 250 mL/hr over 60 Minutes Intravenous Every 24 hours 12/24/22 2303 12/27/22 1204   12/24/22 2000  vancomycin (VANCOREADY) IVPB 1750 mg/350 mL        1,750 mg 175 mL/hr over 120 Minutes Intravenous  Once 12/24/22 1840 12/24/22 2327   12/24/22 1800  ceFEPIme (MAXIPIME) 2 g in sodium chloride 0.9 % 100 mL IVPB        2 g 200 mL/hr over 30 Minutes Intravenous  Once 12/24/22 1749 12/24/22 1835   12/24/22 1745  aztreonam (AZACTAM) 2 g in sodium chloride 0.9 % 100 mL IVPB  Status:  Discontinued        2 g 200 mL/hr over 30 Minutes Intravenous  Once 12/24/22 1739 12/24/22 1748   12/24/22 1745  metroNIDAZOLE (FLAGYL) IVPB 500 mg        500 mg 100 mL/hr over 60 Minutes Intravenous  Once 12/24/22 1739 12/24/22 1923   12/24/22 1745  vancomycin (VANCOCIN) IVPB 1000 mg/200 mL premix  Status:  Discontinued        1,000 mg 200 mL/hr over 60 Minutes Intravenous  Once 12/24/22 1739 12/24/22 1840              Family Communication/Anticipated D/C date and plan/Code Status   DVT prophylaxis: heparin injection 5,000 Units Start: 12/24/22 2200 Place TED hose Start: 12/24/22 1827     Code Status: Do not attempt resuscitation (DNR) - Comfort care  Family Communication: Plan discussed with [name], over the phone. Disposition Plan: Plan t to discharge to SNF   Status is: Inpatient Remains inpatient appropriate because: Awaiting placement to SNF       Subjective:   Interval events noted.  No acute events overnight.  He is confused and cannot provide any history.  He still has coughing spells after eating.  Objective:    Vitals:   01/01/23 1603 01/01/23 2057 01/02/23 0501 01/02/23  0838  BP: (!) 144/76 (!) 126/51 126/77 (!) 149/70  Pulse: 96 79 76 82  Resp: 19 20 16 19   Temp: 99.5 F (37.5 C) 98.2 F (36.8 C) 98 F (36.7 C) (!) 97.5 F (36.4 C)  TempSrc:  Oral Oral Oral  SpO2: 97% 98% 98% 98%  Weight:      Height:  No data found.   Intake/Output Summary (Last 24 hours) at 01/02/2023 1410 Last data filed at 01/02/2023 1400 Gross per 24 hour  Intake --  Output 1000 ml  Net -1000 ml   Filed Weights   12/24/22 0900 12/26/22 0423 12/26/22 2046  Weight: 68 kg 64.6 kg 64.7 kg    Exam:  GEN: NAD, frail  SKIN: Warm and dry EYES: EOMI ENT: MMM CV: RRR PULM: CTA B ABD: soft, ND, NT, +BS CNS: AAO x 1 (person), non focal EXT: No edema or tenderness       Data Reviewed:   I have personally reviewed following labs and imaging studies:  Labs: Labs show the following:   Basic Metabolic Panel: Recent Labs  Lab 12/27/22 0415 12/28/22 0420 12/29/22 0524 12/30/22 0407 12/31/22 0515  NA 139 139 138 141 144  K 3.3* 3.4* 4.1 3.5 4.0  CL 103 101 103 104 107  CO2 27 26 27 29 31   GLUCOSE 86 110* 88 112* 103*  BUN 30* 32* 29* 39* 32*  CREATININE 1.08 1.01 0.89 0.98 1.01  CALCIUM 8.8* 9.1 8.9 9.0 9.3   GFR Estimated Creatinine Clearance: 43.6 mL/min (by C-G formula based on SCr of 1.01 mg/dL). Liver Function Tests: No results for input(s): "AST", "ALT", "ALKPHOS", "BILITOT", "PROT", "ALBUMIN" in the last 168 hours. No results for input(s): "LIPASE", "AMYLASE" in the last 168 hours. No results for input(s): "AMMONIA" in the last 168 hours. Coagulation profile No results for input(s): "INR", "PROTIME" in the last 168 hours.  CBC: Recent Labs  Lab 12/27/22 0415 12/28/22 0420 12/29/22 0524 12/30/22 0407 12/31/22 0515  WBC 10.0 9.0 8.0 6.9 7.7  HGB 12.5* 14.0 13.7 13.2 13.5  HCT 36.6* 41.2 41.3 39.0 39.8  MCV 93.6 93.6 95.4 94.7 94.8  PLT 145* 161 144* 152 166   Cardiac Enzymes: No results for input(s): "CKTOTAL", "CKMB",  "CKMBINDEX", "TROPONINI" in the last 168 hours. BNP (last 3 results) No results for input(s): "PROBNP" in the last 8760 hours. CBG: Recent Labs  Lab 01/01/23 1148 01/01/23 1605 01/01/23 2025 01/02/23 0836 01/02/23 1129  GLUCAP 122* 125* 120* 111* 122*   D-Dimer: No results for input(s): "DDIMER" in the last 72 hours. Hgb A1c: No results for input(s): "HGBA1C" in the last 72 hours. Lipid Profile: No results for input(s): "CHOL", "HDL", "LDLCALC", "TRIG", "CHOLHDL", "LDLDIRECT" in the last 72 hours. Thyroid function studies: No results for input(s): "TSH", "T4TOTAL", "T3FREE", "THYROIDAB" in the last 72 hours.  Invalid input(s): "FREET3" Anemia work up: No results for input(s): "VITAMINB12", "FOLATE", "FERRITIN", "TIBC", "IRON", "RETICCTPCT" in the last 72 hours. Sepsis Labs: Recent Labs  Lab 12/28/22 0420 12/29/22 0524 12/30/22 0407 12/31/22 0515  WBC 9.0 8.0 6.9 7.7    Microbiology Recent Results (from the past 240 hour(s))  SARS Coronavirus 2 by RT PCR (hospital order, performed in [facility] hospital lab) *cepheid single result test* Anterior Nasal Swab     Status: None   Collection Time: 12/24/22  2:06 PM   Specimen: Anterior Nasal Swab  Result Value Ref Range Status   SARS Coronavirus 2 by RT PCR NEGATIVE NEGATIVE Final    Comment: (NOTE) SARS-CoV-2 target nucleic acids are NOT DETECTED.  The SARS-CoV-2 RNA is generally detectable in upper and lower respiratory specimens during the acute phase of infection. The lowest concentration of SARS-CoV-2 viral copies this assay can detect is 250 copies / mL. A negative result does not preclude SARS-CoV-2 infection and should not be used as the sole  basis for treatment or other patient management decisions.  A negative result may occur with improper specimen collection / handling, submission of specimen other than nasopharyngeal swab, presence of viral mutation(s) within the areas targeted by this assay, and inadequate  number of viral copies (<250 copies / mL). A negative result must be combined with clinical observations, patient history, and epidemiological information.  Fact Sheet for Patients:   RoadLapTop.co.za  Fact Sheet for Healthcare Providers: http://kim-miller.com/  This test is not yet approved or  cleared by the [place] FDA and has been authorized for detection and/or diagnosis of SARS-CoV-2 by FDA under an Emergency Use Authorization (EUA).  This EUA will remain in effect (meaning this test can be used) for the duration of the COVID-19 declaration under Section 564(b)(1) of the Act, 21 U.S.C. section 360bbb-3(b)(1), unless the authorization is terminated or revoked sooner.  Performed at [facility], [address] [place], [place] [address]   Blood culture (routine x 2)     Status: None   Collection Time: 12/24/22  2:06 PM   Specimen: BLOOD  Result Value Ref Range Status   Specimen Description BLOOD BLOOD LEFT FOREARM  Final   Special Requests   Final    BOTTLES DRAWN AEROBIC AND ANAEROBIC Blood Culture results may not be optimal due to an inadequate volume of blood received in culture bottles   Culture   Final    NO GROWTH 5 DAYS Performed at [facility], [address], [place], [place] [address]    Report Status 12/29/2022 FINAL  Final  Blood culture (routine x 2)     Status: Abnormal   Collection Time: 12/24/22  2:07 PM   Specimen: BLOOD  Result Value Ref Range Status   Specimen Description   Final    BLOOD BLOOD RIGHT FOREARM Performed at [facility], [address] [place], [place] [address]    Special Requests   Final    BOTTLES DRAWN AEROBIC AND ANAEROBIC Blood Culture results may not be optimal due to an inadequate volume of blood received in culture bottles Performed at [facility], [address], [place], [place] [address]    Culture  Setup Time   Final    GRAM  POSITIVE COCCI IN BOTH AEROBIC AND ANAEROBIC BOTTLES Organism ID to follow CRITICAL RESULT CALLED TO, READ BACK BY AND VERIFIED WITH: [email or web address]  12/25/22 RH Performed at [facility], [address] [place], [place] [address]    Culture (A)  Final    STAPHYLOCOCCUS HOMINIS THE SIGNIFICANCE OF ISOLATING THIS ORGANISM FROM A SINGLE SET OF BLOOD CULTURES WHEN MULTIPLE SETS ARE DRAWN IS UNCERTAIN. PLEASE NOTIFY THE MICROBIOLOGY DEPARTMENT WITHIN ONE WEEK IF SPECIATION AND SENSITIVITIES ARE REQUIRED. Performed at [facility], [address] [place], [place] [address]    Report Status 12/27/2022 FINAL  Final  Blood Culture ID Panel (Reflexed)     Status: Abnormal   Collection Time: 12/24/22  2:07 PM  Result Value Ref Range Status   Enterococcus faecalis NOT DETECTED NOT DETECTED Final   Enterococcus Faecium NOT DETECTED NOT DETECTED Final   Listeria monocytogenes NOT DETECTED NOT DETECTED Final   Staphylococcus species DETECTED (A) NOT DETECTED Final    Comment: CRITICAL RESULT CALLED TO, READ BACK BY AND VERIFIED WITH: Harrold Donath [id number] 12/25/22 RH    Staphylococcus aureus (BCID) NOT DETECTED NOT DETECTED Final   Staphylococcus epidermidis NOT DETECTED NOT DETECTED Final   Staphylococcus lugdunensis NOT DETECTED NOT  DETECTED Final   Streptococcus species NOT DETECTED NOT DETECTED Final   Streptococcus agalactiae NOT DETECTED NOT DETECTED Final   Streptococcus pneumoniae NOT DETECTED NOT DETECTED Final   Streptococcus pyogenes NOT DETECTED NOT DETECTED Final   A.calcoaceticus-baumannii NOT DETECTED NOT DETECTED Final   Bacteroides fragilis NOT DETECTED NOT DETECTED Final   Enterobacterales NOT DETECTED NOT DETECTED Final   Enterobacter cloacae complex NOT DETECTED NOT DETECTED Final   Escherichia coli NOT DETECTED NOT DETECTED Final   Klebsiella aerogenes NOT DETECTED NOT DETECTED Final   Klebsiella oxytoca NOT DETECTED NOT DETECTED Final   Klebsiella pneumoniae NOT  DETECTED NOT DETECTED Final   Proteus species NOT DETECTED NOT DETECTED Final   Salmonella species NOT DETECTED NOT DETECTED Final   Serratia marcescens NOT DETECTED NOT DETECTED Final   Haemophilus influenzae NOT DETECTED NOT DETECTED Final   Neisseria meningitidis NOT DETECTED NOT DETECTED Final   Pseudomonas aeruginosa NOT DETECTED NOT DETECTED Final   Stenotrophomonas maltophilia NOT DETECTED NOT DETECTED Final   Candida albicans NOT DETECTED NOT DETECTED Final   Candida auris NOT DETECTED NOT DETECTED Final   Candida glabrata NOT DETECTED NOT DETECTED Final   Candida krusei NOT DETECTED NOT DETECTED Final   Candida parapsilosis NOT DETECTED NOT DETECTED Final   Candida tropicalis NOT DETECTED NOT DETECTED Final   Cryptococcus neoformans/gattii NOT DETECTED NOT DETECTED Final    Comment: Performed at [facility], [address] [place], [place] [address]  Culture, blood (Routine X 2) w Reflex to ID Panel     Status: None   Collection Time: 12/25/22  9:19 AM   Specimen: BLOOD LEFT HAND  Result Value Ref Range Status   Specimen Description BLOOD LEFT HAND  Final   Special Requests   Final    BOTTLES DRAWN AEROBIC AND ANAEROBIC Blood Culture adequate volume   Culture   Final    NO GROWTH 5 DAYS Performed at [facility], [address] [place], [place] [address]    Report Status 12/30/2022 FINAL  Final  Culture, blood (Routine X 2) w Reflex to ID Panel     Status: None   Collection Time: 12/25/22  9:27 AM   Specimen: BLOOD LEFT HAND  Result Value Ref Range Status   Specimen Description BLOOD LEFT HAND  Final   Special Requests   Final    BOTTLES DRAWN AEROBIC AND ANAEROBIC Blood Culture adequate volume   Culture   Final    NO GROWTH 5 DAYS Performed at [facility], [address] [place], [place] [address]    Report Status 12/30/2022 FINAL  Final  Urine Culture (for pregnant, neutropenic or urologic patients or patients with an indwelling  urinary catheter)     Status: None   Collection Time: 12/25/22 10:29 AM   Specimen: In/Out Cath Urine  Result Value Ref Range Status   Specimen Description   Final    IN/OUT CATH URINE Performed at [facility], [address] [place], [place] [address]    Special Requests   Final    NONE Performed at [facility], [address] [place], [place] [address]    Culture   Final    NO GROWTH Performed at [facility], [address], [place], [place] [address]    Report Status 12/26/2022 FINAL  Final    Procedures and diagnostic studies:  No results found.             LOS: 9  days   Srihith Aquilino  [facility]   Pager on [email or web address]. If 7PM-7AM, please contact night-coverage at [email or web address]     01/02/2023, 2:10 PM

## 2023-01-02 NOTE — Care Management Important Message (Signed)
Important Message  Patient Details  Name: Adam [name] MRN: [id number] Date of Birth: 11-22-1929   Medicare Important Message Given:  Yes     [name] 01/02/2023, 10:24 AM

## 2023-01-02 NOTE — Progress Notes (Signed)
SLP Note  Patient Details Name: Adam [name] MRN: [id number] DOB: July 24, 1929  This write made aware by pt's nurse that pt was coughing with current PO diet. Education provided  to pt's current medical team that results of Modified Barium Swallow Study on 12/26/2022 revealed silent and sensed aspiration with strict NPO recommended.   Per chart, 12/26/2022 MD wrote "soft diet for comfort as per pt's family request."   At this time, skilled ST services do not have any further ability to decrease pt's risk of aspiration with PO consumption.   Renlee Floor B. Dreama Saa, M.S., CCC-SLP, Tree surgeon Certified Brain Injury Specialist [facility]  [facility] Office [phone] Ascom [phone] Fax [phone]

## 2023-01-03 DIAGNOSIS — A419 Sepsis, unspecified organism: Secondary | ICD-10-CM | POA: Diagnosis not present

## 2023-01-03 LAB — GLUCOSE, CAPILLARY
Glucose-Capillary: 121 mg/dL — ABNORMAL HIGH (ref 70–99)
Glucose-Capillary: 106 mg/dL — ABNORMAL HIGH (ref 70–99)
Glucose-Capillary: 97 mg/dL (ref 70–99)
Glucose-Capillary: 113 mg/dL — ABNORMAL HIGH (ref 70–99)

## 2023-01-03 NOTE — Progress Notes (Signed)
Progress Note    Adam [name]  DGU:[id number] DOB: [birth year]  DOA: 12/24/2022 PCP: Marina Goodell, MD      Brief Narrative:    Medical records reviewed and are as summarized below:  Adam [name] is a 89 y.o. male with dementia, hyperlipidemia, hypertension, GERD, who presented to the hospital because of altered mental status.  Reportedly, patient was found wandering in the woods by his neighbor.  He said he had been under a lot of stress since his wife passed away.  He was admitted to the hospital for sepsis possibly due to aspiration pneumonia and UTI.     Assessment/Plan:   Principal Problem:   Sepsis (HCC) Active Problems:   Essential hypertension   Gastroesophageal reflux disease without esophagitis   Primary osteoarthritis of left knee   Primary osteoarthritis of right knee   Pure hypercholesterolemia   Type 2 diabetes mellitus with stage 3 chronic kidney disease, without long-term current use of insulin (HCC)   AMS (altered mental status)   Elevated serum creatinine   Candida rash of groin   Palliative care encounter   Body mass index is 22.34 kg/m.   Sepsis secondary to UTI and aspiration pneumonia: Completed antibiotics. Staph hominis bacteremia: 2 out of 4 blood culture positive for Staph hominis.  This is thought to be a contaminant.   Acute metabolic encephalopathy versus delirium on underlying dementia: Patient appears to be at baseline.   Dysphagia: Ideally he should be n.p.o. but he is on dysphagia 1 diet for comfort.  His son understands that he is at increased risk for aspiration.   Urinary retention: Foley catheter was initially placed on 12/25/2022.  It was removed on 12/31/2022.  Unfortunately, patient failed voiding trial. Foley catheter was replaced on 01/01/2023.  Hematuria: Resolved.  This was attributed to pulling on catheter tube.   AKI, hypokalemia: Resolved   Other comorbidities include type II DM, hypertension,  hyperlipidemia   Awaiting placement to SNF.   Diet Order             DIET - DYS 1 Room service appropriate? Yes; Fluid consistency: Thin  Diet effective now                            Consultants: Palliative care  Procedures: None    Medications:    Chlorhexidine Gluconate Cloth  6 each Topical Daily   heparin  5,000 Units Subcutaneous Q8H   influenza vaccine adjuvanted  0.5 mL Intramuscular Tomorrow-1000   insulin aspart  0-5 Units Subcutaneous QHS   insulin aspart  0-9 Units Subcutaneous TID WC   pantoprazole (PROTONIX) IV  40 mg Intravenous Q24H   scopolamine  1 patch Transdermal Q72H   Continuous Infusions:   Anti-infectives (From admission, onward)    Start     Dose/Rate Route Frequency Ordered Stop   12/27/22 2200  azithromycin (ZITHROMAX) tablet 500 mg  Status:  Discontinued        500 mg Oral Daily at bedtime 12/27/22 1204 12/27/22 1205   12/27/22 2200  azithromycin (ZITHROMAX) 500 mg in sodium chloride 0.9 % 250 mL IVPB        500 mg 250 mL/hr over 60 Minutes Intravenous Daily at bedtime 12/27/22 1215 12/28/22 2311   12/25/22 1800  ceFEPIme (MAXIPIME) 2 g in sodium chloride 0.9 % 100 mL IVPB  Status:  Discontinued        2 g 200  mL/hr over 30 Minutes Intravenous  Once 12/24/22 1850 12/24/22 1851   12/25/22 1800  ceFEPIme (MAXIPIME) 2 g in sodium chloride 0.9 % 100 mL IVPB  Status:  Discontinued        2 g 200 mL/hr over 30 Minutes Intravenous Every 24 hours 12/24/22 1851 12/25/22 0822   12/25/22 0830  cefTRIAXone (ROCEPHIN) 1 g in sodium chloride 0.9 % 100 mL IVPB  Status:  Discontinued        1 g 200 mL/hr over 30 Minutes Intravenous Every 24 hours 12/25/22 0823 12/30/22 1408   12/25/22 0600  metroNIDAZOLE (FLAGYL) IVPB 500 mg  Status:  Discontinued        500 mg 100 mL/hr over 60 Minutes Intravenous Every 12 hours 12/24/22 1826 12/25/22 0823   12/24/22 2315  azithromycin (ZITHROMAX) 500 mg in sodium chloride 0.9 % 250 mL IVPB  Status:   Discontinued        500 mg 250 mL/hr over 60 Minutes Intravenous Every 24 hours 12/24/22 2303 12/27/22 1204   12/24/22 2000  vancomycin (VANCOREADY) IVPB 1750 mg/350 mL        1,750 mg 175 mL/hr over 120 Minutes Intravenous  Once 12/24/22 1840 12/24/22 2327   12/24/22 1800  ceFEPIme (MAXIPIME) 2 g in sodium chloride 0.9 % 100 mL IVPB        2 g 200 mL/hr over 30 Minutes Intravenous  Once 12/24/22 1749 12/24/22 1835   12/24/22 1745  aztreonam (AZACTAM) 2 g in sodium chloride 0.9 % 100 mL IVPB  Status:  Discontinued        2 g 200 mL/hr over 30 Minutes Intravenous  Once 12/24/22 1739 12/24/22 1748   12/24/22 1745  metroNIDAZOLE (FLAGYL) IVPB 500 mg        500 mg 100 mL/hr over 60 Minutes Intravenous  Once 12/24/22 1739 12/24/22 1923   12/24/22 1745  vancomycin (VANCOCIN) IVPB 1000 mg/200 mL premix  Status:  Discontinued        1,000 mg 200 mL/hr over 60 Minutes Intravenous  Once 12/24/22 1739 12/24/22 1840              Family Communication/Anticipated D/C date and plan/Code Status   DVT prophylaxis: heparin injection 5,000 Units Start: 12/24/22 2200 Place TED hose Start: 12/24/22 1827     Code Status: Do not attempt resuscitation (DNR) - Comfort care  Family Communication: None Disposition Plan: Plan t to discharge to SNF   Status is: Inpatient Remains inpatient appropriate because: Awaiting placement to SNF       Subjective:   Interval events noted.  He has no complaints.  Of note he is confused and cannot provide an adequate history.  Objective:    Vitals:   01/02/23 0501 01/02/23 0838 01/02/23 1641 01/03/23 0734  BP: 126/77 (!) 149/70 (!) 151/70 129/67  Pulse: 76 82 79 75  Resp: 16 19 19 16   Temp: 98 F (36.7 C) (!) 97.5 F (36.4 C) 98.3 F (36.8 C)   TempSrc: Oral Oral Oral   SpO2: 98% 98% 97% 98%  Weight:      Height:       No data found.   Intake/Output Summary (Last 24 hours) at 01/03/2023 1439 Last data filed at 01/03/2023 0554 Gross per  24 hour  Intake --  Output 650 ml  Net -650 ml   Filed Weights   12/24/22 0900 12/26/22 0423 12/26/22 2046  Weight: 68 kg 64.6 kg 64.7 kg    Exam:  GEN: NAD SKIN: Warm and dry EYES: No pallor or icterus ENT: MMM CV: RRR PULM: CTA B ABD: soft, ND, NT, +BS CNS: AAO x 1 (person), confused, non focal EXT: No edema or tenderness       Data Reviewed:   I have personally reviewed following labs and imaging studies:  Labs: Labs show the following:   Basic Metabolic Panel: Recent Labs  Lab 12/28/22 0420 12/29/22 0524 12/30/22 0407 12/31/22 0515  NA 139 138 141 144  K 3.4* 4.1 3.5 4.0  CL 101 103 104 107  CO2 26 27 29 31   GLUCOSE 110* 88 112* 103*  BUN 32* 29* 39* 32*  CREATININE 1.01 0.89 0.98 1.01  CALCIUM 9.1 8.9 9.0 9.3   GFR Estimated Creatinine Clearance: 43.6 mL/min (by C-G formula based on SCr of 1.01 mg/dL). Liver Function Tests: No results for input(s): "AST", "ALT", "ALKPHOS", "BILITOT", "PROT", "ALBUMIN" in the last 168 hours. No results for input(s): "LIPASE", "AMYLASE" in the last 168 hours. No results for input(s): "AMMONIA" in the last 168 hours. Coagulation profile No results for input(s): "INR", "PROTIME" in the last 168 hours.  CBC: Recent Labs  Lab 12/28/22 0420 12/29/22 0524 12/30/22 0407 12/31/22 0515  WBC 9.0 8.0 6.9 7.7  HGB 14.0 13.7 13.2 13.5  HCT 41.2 41.3 39.0 39.8  MCV 93.6 95.4 94.7 94.8  PLT 161 144* 152 166   Cardiac Enzymes: No results for input(s): "CKTOTAL", "CKMB", "CKMBINDEX", "TROPONINI" in the last 168 hours. BNP (last 3 results) No results for input(s): "PROBNP" in the last 8760 hours. CBG: Recent Labs  Lab 01/02/23 1129 01/02/23 1639 01/02/23 2158 01/03/23 0731 01/03/23 1157  GLUCAP 122* 114* 107* 121* 106*   D-Dimer: No results for input(s): "DDIMER" in the last 72 hours. Hgb A1c: No results for input(s): "HGBA1C" in the last 72 hours. Lipid Profile: No results for input(s): "CHOL", "HDL",  "LDLCALC", "TRIG", "CHOLHDL", "LDLDIRECT" in the last 72 hours. Thyroid function studies: No results for input(s): "TSH", "T4TOTAL", "T3FREE", "THYROIDAB" in the last 72 hours.  Invalid input(s): "FREET3" Anemia work up: No results for input(s): "VITAMINB12", "FOLATE", "FERRITIN", "TIBC", "IRON", "RETICCTPCT" in the last 72 hours. Sepsis Labs: Recent Labs  Lab 12/28/22 0420 12/29/22 0524 12/30/22 0407 12/31/22 0515  WBC 9.0 8.0 6.9 7.7    Microbiology Recent Results (from the past 240 hour(s))  Culture, blood (Routine X 2) w Reflex to ID Panel     Status: None   Collection Time: 12/25/22  9:19 AM   Specimen: BLOOD LEFT HAND  Result Value Ref Range Status   Specimen Description BLOOD LEFT HAND  Final   Special Requests   Final    BOTTLES DRAWN AEROBIC AND ANAEROBIC Blood Culture adequate volume   Culture   Final    NO GROWTH 5 DAYS Performed at [facility], [address], [place], [place] [address]    Report Status 12/30/2022 FINAL  Final  Culture, blood (Routine X 2) w Reflex to ID Panel     Status: None   Collection Time: 12/25/22  9:27 AM   Specimen: BLOOD LEFT HAND  Result Value Ref Range Status   Specimen Description BLOOD LEFT HAND  Final   Special Requests   Final    BOTTLES DRAWN AEROBIC AND ANAEROBIC Blood Culture adequate volume   Culture   Final    NO GROWTH 5 DAYS Performed at [facility], [address], [place], [place] [address]    Report Status 12/30/2022 FINAL  Final  Urine Culture (for pregnant, neutropenic or urologic patients or patients with an indwelling urinary catheter)     Status: None   Collection Time: 12/25/22 10:29 AM   Specimen: In/Out Cath Urine  Result Value Ref Range Status   Specimen Description   Final    IN/OUT CATH URINE Performed at [facility], [address], [place], [place] [address]    Special Requests   Final    NONE Performed at [facility], [address], [place],  [place] [address]    Culture   Final    NO GROWTH Performed at [facility], [address], [place], [place] [address]    Report Status 12/26/2022 FINAL  Final    Procedures and diagnostic studies:  No results found.             LOS: 10 days   Darleny Sem  [facility]   Pager on [email or web address]. If 7PM-7AM, please contact night-coverage at [email or web address]     01/03/2023, 2:39 PM

## 2023-01-03 NOTE — TOC Progression Note (Signed)
Transition of Care [facility]) - Progression Note    Patient Details  Name: Adam [name] MRN: [id number] Date of Birth: [birth year]  Transition of Care [facility]) CM/SW Contact  Allena Katz, LCSW Phone Number: 01/03/2023, 9:10 AM  Clinical Narrative:   CSW called patients son to share that [facility] was not able to extend a bed offer. Son reports he would like [place] to call [facility]. Message left for admissions at [facility] to look at patient.     Expected Discharge Plan: Skilled Nursing Facility Barriers to Discharge: Continued Medical Work up  Expected Discharge Plan and Services       Living arrangements for the past 2 months: Single Family Home                                       Social Determinants of Health (SDOH) Interventions SDOH Screenings   Food Insecurity: Patient Unable To Answer (12/25/2022)  Housing: Patient Unable To Answer (12/25/2022)  Transportation Needs: Patient Unable To Answer (12/25/2022)  Utilities: Patient Unable To Answer (12/25/2022)  Financial Resource Strain: Low Risk  (10/20/2022)   Received from [facility]  Tobacco Use: Medium Risk (12/24/2022)    Readmission Risk Interventions     No data to display

## 2023-01-03 NOTE — Plan of Care (Signed)
  Problem: Fluid Volume: Goal: Hemodynamic stability will improve Outcome: Progressing   Problem: Clinical Measurements: Goal: Diagnostic test results will improve Outcome: Progressing Goal: Signs and symptoms of infection will decrease Outcome: Progressing   Problem: Respiratory: Goal: Ability to maintain adequate ventilation will improve Outcome: Progressing   Problem: Education: Goal: Ability to describe self-care measures that may prevent or decrease complications (Diabetes Survival Skills Education) will improve Outcome: Progressing Goal: Individualized Educational Video(s) Outcome: Progressing   Problem: Coping: Goal: Ability to adjust to condition or change in health will improve Outcome: Progressing   Problem: Fluid Volume: Goal: Ability to maintain a balanced intake and output will improve Outcome: Progressing   Problem: Health Behavior/Discharge Planning: Goal: Ability to identify and utilize available resources and services will improve Outcome: Progressing Goal: Ability to manage health-related needs will improve Outcome: Progressing   Problem: Metabolic: Goal: Ability to maintain appropriate glucose levels will improve Outcome: Progressing   Problem: Nutritional: Goal: Maintenance of adequate nutrition will improve Outcome: Progressing Goal: Progress toward achieving an optimal weight will improve Outcome: Progressing   Problem: Skin Integrity: Goal: Risk for impaired skin integrity will decrease Outcome: Progressing   Problem: Tissue Perfusion: Goal: Adequacy of tissue perfusion will improve Outcome: Progressing   Problem: Education: Goal: Knowledge of General Education information will improve Description: Including pain rating scale, medication(s)/side effects and non-pharmacologic comfort measures Outcome: Progressing   Problem: Health Behavior/Discharge Planning: Goal: Ability to manage health-related needs will improve Outcome:  Progressing   Problem: Clinical Measurements: Goal: Ability to maintain clinical measurements within normal limits will improve Outcome: Progressing Goal: Will remain free from infection Outcome: Progressing Goal: Diagnostic test results will improve Outcome: Progressing Goal: Respiratory complications will improve Outcome: Progressing Goal: Cardiovascular complication will be avoided Outcome: Progressing   Problem: Activity: Goal: Risk for activity intolerance will decrease Outcome: Progressing   Problem: Nutrition: Goal: Adequate nutrition will be maintained Outcome: Progressing   Problem: Coping: Goal: Level of anxiety will decrease Outcome: Progressing   Problem: Elimination: Goal: Will not experience complications related to bowel motility Outcome: Progressing Goal: Will not experience complications related to urinary retention Outcome: Progressing   Problem: Pain Managment: Goal: General experience of comfort will improve Outcome: Progressing   Problem: Safety: Goal: Ability to remain free from injury will improve Outcome: Progressing   Problem: Skin Integrity: Goal: Risk for impaired skin integrity will decrease Outcome: Progressing   Problem: Safety: Goal: Non-violent Restraint(s) Outcome: Progressing

## 2023-01-04 DIAGNOSIS — A419 Sepsis, unspecified organism: Secondary | ICD-10-CM | POA: Diagnosis not present

## 2023-01-04 LAB — CBC WITH DIFFERENTIAL/PLATELET
Abs Immature Granulocytes: 0.04 10*3/uL (ref 0.00–0.07)
Basophils Absolute: 0.1 10*3/uL (ref 0.0–0.1)
Basophils Relative: 1 %
Eosinophils Absolute: 0.1 10*3/uL (ref 0.0–0.5)
Eosinophils Relative: 1 %
HCT: 50.2 % (ref 39.0–52.0)
Hemoglobin: 16.2 g/dL (ref 13.0–17.0)
Immature Granulocytes: 0 %
Lymphocytes Relative: 8 %
Lymphs Abs: 0.9 10*3/uL (ref 0.7–4.0)
MCH: 31.1 pg (ref 26.0–34.0)
MCHC: 32.3 g/dL (ref 30.0–36.0)
MCV: 96.4 fL (ref 80.0–100.0)
Monocytes Absolute: 1.2 10*3/uL — ABNORMAL HIGH (ref 0.1–1.0)
Monocytes Relative: 11 %
Neutro Abs: 8.7 10*3/uL — ABNORMAL HIGH (ref 1.7–7.7)
Neutrophils Relative %: 79 %
Platelets: 245 10*3/uL (ref 150–400)
RBC: 5.21 MIL/uL (ref 4.22–5.81)
RDW: 12.6 % (ref 11.5–15.5)
WBC: 10.9 10*3/uL — ABNORMAL HIGH (ref 4.0–10.5)
nRBC: 0 % (ref 0.0–0.2)

## 2023-01-04 LAB — BASIC METABOLIC PANEL
Anion gap: 15 (ref 5–15)
BUN: 44 mg/dL — ABNORMAL HIGH (ref 8–23)
CO2: 23 mmol/L (ref 22–32)
Calcium: 9.7 mg/dL (ref 8.9–10.3)
Chloride: 109 mmol/L (ref 98–111)
Creatinine, Ser: 1.23 mg/dL (ref 0.61–1.24)
GFR, Estimated: 55 mL/min — ABNORMAL LOW (ref >60)
Glucose, Bld: 142 mg/dL — ABNORMAL HIGH (ref 70–99)
Potassium: 3.5 mmol/L (ref 3.5–5.1)
Sodium: 147 mmol/L — ABNORMAL HIGH (ref 135–145)

## 2023-01-04 LAB — GLUCOSE, CAPILLARY
Glucose-Capillary: 114 mg/dL — ABNORMAL HIGH (ref 70–99)
Glucose-Capillary: 122 mg/dL — ABNORMAL HIGH (ref 70–99)
Glucose-Capillary: 117 mg/dL — ABNORMAL HIGH (ref 70–99)
Glucose-Capillary: 124 mg/dL — ABNORMAL HIGH (ref 70–99)

## 2023-01-04 MED ORDER — SODIUM CHLORIDE 0.45 % IV SOLN: INTRAVENOUS | Status: DC

## 2023-01-04 NOTE — Plan of Care (Signed)
  Problem: Fluid Volume: Goal: Hemodynamic stability will improve Outcome: Progressing   Problem: Clinical Measurements: Goal: Diagnostic test results will improve Outcome: Progressing Goal: Signs and symptoms of infection will decrease Outcome: Progressing   Problem: Respiratory: Goal: Ability to maintain adequate ventilation will improve Outcome: Progressing   Problem: Education: Goal: Ability to describe self-care measures that may prevent or decrease complications (Diabetes Survival Skills Education) will improve Outcome: Progressing Goal: Individualized Educational Video(s) Outcome: Progressing   Problem: Coping: Goal: Ability to adjust to condition or change in health will improve Outcome: Progressing   Problem: Fluid Volume: Goal: Ability to maintain a balanced intake and output will improve Outcome: Progressing   Problem: Health Behavior/Discharge Planning: Goal: Ability to identify and utilize available resources and services will improve Outcome: Progressing Goal: Ability to manage health-related needs will improve Outcome: Progressing   Problem: Metabolic: Goal: Ability to maintain appropriate glucose levels will improve Outcome: Progressing   Problem: Nutritional: Goal: Maintenance of adequate nutrition will improve Outcome: Progressing Goal: Progress toward achieving an optimal weight will improve Outcome: Progressing   Problem: Skin Integrity: Goal: Risk for impaired skin integrity will decrease Outcome: Progressing   Problem: Tissue Perfusion: Goal: Adequacy of tissue perfusion will improve Outcome: Progressing   Problem: Education: Goal: Knowledge of General Education information will improve Description: Including pain rating scale, medication(s)/side effects and non-pharmacologic comfort measures Outcome: Progressing   Problem: Health Behavior/Discharge Planning: Goal: Ability to manage health-related needs will improve Outcome:  Progressing   Problem: Clinical Measurements: Goal: Ability to maintain clinical measurements within normal limits will improve Outcome: Progressing Goal: Will remain free from infection Outcome: Progressing Goal: Diagnostic test results will improve Outcome: Progressing Goal: Respiratory complications will improve Outcome: Progressing Goal: Cardiovascular complication will be avoided Outcome: Progressing   Problem: Activity: Goal: Risk for activity intolerance will decrease Outcome: Progressing   Problem: Nutrition: Goal: Adequate nutrition will be maintained Outcome: Progressing   Problem: Coping: Goal: Level of anxiety will decrease Outcome: Progressing   Problem: Elimination: Goal: Will not experience complications related to bowel motility Outcome: Progressing Goal: Will not experience complications related to urinary retention Outcome: Progressing   Problem: Pain Managment: Goal: General experience of comfort will improve Outcome: Progressing   Problem: Safety: Goal: Ability to remain free from injury will improve Outcome: Progressing   Problem: Skin Integrity: Goal: Risk for impaired skin integrity will decrease Outcome: Progressing   Problem: Safety: Goal: Non-violent Restraint(s) Outcome: Progressing

## 2023-01-04 NOTE — Plan of Care (Signed)
Problem: Fluid Volume: Goal: Ability to maintain a balanced intake and output will improve Outcome: Progressing   Problem: Health Behavior/Discharge Planning: Goal: Ability to identify and utilize available resources and services will improve Outcome: Progressing

## 2023-01-04 NOTE — Progress Notes (Signed)
Progress Note    KOAH HINCHEE  BJY:[id number] DOB: December 11, 1929  DOA: 12/24/2022 PCP: Marina Goodell, MD      Brief Narrative:    Medical records reviewed and are as summarized below:  Adam [name] is a 89 y.o. male with dementia, hyperlipidemia, hypertension, GERD, who presented to the hospital because of altered mental status.  Reportedly, patient was found wandering in the woods by his neighbor.  He said he had been under a lot of stress since his wife passed away.  He was admitted to the hospital for sepsis possibly due to aspiration pneumonia and UTI.     Assessment/Plan:   Principal Problem:   Sepsis (HCC) Active Problems:   Essential hypertension   Gastroesophageal reflux disease without esophagitis   Primary osteoarthritis of left knee   Primary osteoarthritis of right knee   Pure hypercholesterolemia   Type 2 diabetes mellitus with stage 3 chronic kidney disease, without long-term current use of insulin (HCC)   AMS (altered mental status)   Elevated serum creatinine   Candida rash of groin   Palliative care encounter   Body mass index is 22.34 kg/m.   Sepsis secondary to UTI and aspiration pneumonia: Completed antibiotics. Staph hominis bacteremia: 2 out of 4 blood culture positive for Staph hominis.  This is thought to be a contaminant.   Acute metabolic encephalopathy versus delirium on underlying dementia: Patient was more lethargic today likely from dehydration and hypernatremia.   Hypernatremia, dehydration: Oral intake has been poor.  Family was at the bedside and they said that Randa Evens (daughter) is visiting from another state.  Reportedly, patient would normally be very responsive to his daughter.  Family wants him to have IV fluids for hydration with the hope that he would be more awake and alert to communicate with Randa Evens who will be here today and tomorrow.  Start half-normal saline infusion and repeat BMP tomorrow   Dysphagia:  Ideally he should be n.p.o. but he is on dysphagia 1 diet for comfort.  His son understands that he is at increased risk for aspiration.   Urinary retention: Foley catheter was initially placed on 12/25/2022.  It was removed on 12/31/2022.  Unfortunately, patient failed voiding trial. Foley catheter was replaced on 01/01/2023.  Hematuria: Resolved.  This was attributed to pulling on catheter tube.   AKI, hypokalemia: Resolved   Other comorbidities include type II DM, hypertension, hyperlipidemia   Awaiting placement to SNF.   Plan of care was discussed with his family at the bedside.  Goals of care were discussed.  They want to focus on keeping the patient comfortable.  However, for now they want him to have IV fluids.  CBGs and insulin sliding scale will be discontinued.   Diet Order             DIET - DYS 1 Room service appropriate? Yes; Fluid consistency: Thin  Diet effective now                            Consultants: Palliative care  Procedures: None    Medications:    Chlorhexidine Gluconate Cloth  6 each Topical Daily   heparin  5,000 Units Subcutaneous Q8H   influenza vaccine adjuvanted  0.5 mL Intramuscular Tomorrow-1000   pantoprazole (PROTONIX) IV  40 mg Intravenous Q24H   scopolamine  1 patch Transdermal Q72H   Continuous Infusions:   Anti-infectives (From admission, onward)  Start     Dose/Rate Route Frequency Ordered Stop   12/27/22 2200  azithromycin (ZITHROMAX) tablet 500 mg  Status:  Discontinued        500 mg Oral Daily at bedtime 12/27/22 1204 12/27/22 1205   12/27/22 2200  azithromycin (ZITHROMAX) 500 mg in sodium chloride 0.9 % 250 mL IVPB        500 mg 250 mL/hr over 60 Minutes Intravenous Daily at bedtime 12/27/22 1215 12/28/22 2311   12/25/22 1800  ceFEPIme (MAXIPIME) 2 g in sodium chloride 0.9 % 100 mL IVPB  Status:  Discontinued        2 g 200 mL/hr over 30 Minutes Intravenous  Once 12/24/22 1850 12/24/22 1851   12/25/22  1800  ceFEPIme (MAXIPIME) 2 g in sodium chloride 0.9 % 100 mL IVPB  Status:  Discontinued        2 g 200 mL/hr over 30 Minutes Intravenous Every 24 hours 12/24/22 1851 12/25/22 0822   12/25/22 0830  cefTRIAXone (ROCEPHIN) 1 g in sodium chloride 0.9 % 100 mL IVPB  Status:  Discontinued        1 g 200 mL/hr over 30 Minutes Intravenous Every 24 hours 12/25/22 0823 12/30/22 1408   12/25/22 0600  metroNIDAZOLE (FLAGYL) IVPB 500 mg  Status:  Discontinued        500 mg 100 mL/hr over 60 Minutes Intravenous Every 12 hours 12/24/22 1826 12/25/22 0823   12/24/22 2315  azithromycin (ZITHROMAX) 500 mg in sodium chloride 0.9 % 250 mL IVPB  Status:  Discontinued        500 mg 250 mL/hr over 60 Minutes Intravenous Every 24 hours 12/24/22 2303 12/27/22 1204   12/24/22 2000  vancomycin (VANCOREADY) IVPB 1750 mg/350 mL        1,750 mg 175 mL/hr over 120 Minutes Intravenous  Once 12/24/22 1840 12/24/22 2327   12/24/22 1800  ceFEPIme (MAXIPIME) 2 g in sodium chloride 0.9 % 100 mL IVPB        2 g 200 mL/hr over 30 Minutes Intravenous  Once 12/24/22 1749 12/24/22 1835   12/24/22 1745  aztreonam (AZACTAM) 2 g in sodium chloride 0.9 % 100 mL IVPB  Status:  Discontinued        2 g 200 mL/hr over 30 Minutes Intravenous  Once 12/24/22 1739 12/24/22 1748   12/24/22 1745  metroNIDAZOLE (FLAGYL) IVPB 500 mg        500 mg 100 mL/hr over 60 Minutes Intravenous  Once 12/24/22 1739 12/24/22 1923   12/24/22 1745  vancomycin (VANCOCIN) IVPB 1000 mg/200 mL premix  Status:  Discontinued        1,000 mg 200 mL/hr over 60 Minutes Intravenous  Once 12/24/22 1739 12/24/22 1840              Family Communication/Anticipated D/C date and plan/Code Status   DVT prophylaxis: heparin injection 5,000 Units Start: 12/24/22 2200 Place TED hose Start: 12/24/22 1827     Code Status: Do not attempt resuscitation (DNR) - Comfort care  Family Communication: Plan discussed with Mr. Zecharia Himmelberg, Montez Hageman. (son), Randa Evens (daughter)  and daughter-in-law at the bedside. Disposition Plan: Plan to discharge to SNF   Status is: Inpatient Remains inpatient appropriate because: Awaiting placement to SNF       Subjective:   Interval events noted.  He is less responsive than usual.  Weyman Croon, RN and nurse assistant were at the bedside.  Oral intake has been poor.  Objective:    Vitals:  01/03/23 2022 01/04/23 0343 01/04/23 0754 01/04/23 1621  BP: (!) 153/78 (!) 147/74 137/61 121/79  Pulse: 90 85 74 (!) 103  Resp: 20 18 20    Temp: 98.1 F (36.7 C) 98.1 F (36.7 C) 97.6 F (36.4 C) 98.3 F (36.8 C)  TempSrc:  Oral    SpO2: 97% 97% 99% 97%  Weight:      Height:       No data found.   Intake/Output Summary (Last 24 hours) at 01/04/2023 1636 Last data filed at 01/04/2023 1035 Gross per 24 hour  Intake --  Output 550 ml  Net -550 ml   Filed Weights   12/24/22 0900 12/26/22 0423 12/26/22 2046  Weight: 68 kg 64.6 kg 64.7 kg    Exam:  GEN: NAD SKIN: Warm and dry EYES: No pallor or icterus ENT: Dry mucous membranes CV: RRR PULM: CTA B ABD: soft, ND, NT, +BS CNS: Lethargic confused, non focal EXT: No edema or tenderness       Data Reviewed:   I have personally reviewed following labs and imaging studies:  Labs: Labs show the following:   Basic Metabolic Panel: Recent Labs  Lab 12/29/22 0524 12/30/22 0407 12/31/22 0515 01/04/23 1537  NA 138 141 144 147*  K 4.1 3.5 4.0 3.5  CL 103 104 107 109  CO2 27 29 31 23   GLUCOSE 88 112* 103* 142*  BUN 29* 39* 32* 44*  CREATININE 0.89 0.98 1.01 1.23  CALCIUM 8.9 9.0 9.3 9.7   GFR Estimated Creatinine Clearance: 35.8 mL/min (by C-G formula based on SCr of 1.23 mg/dL). Liver Function Tests: No results for input(s): "AST", "ALT", "ALKPHOS", "BILITOT", "PROT", "ALBUMIN" in the last 168 hours. No results for input(s): "LIPASE", "AMYLASE" in the last 168 hours. No results for input(s): "AMMONIA" in the last 168 hours. Coagulation profile No  results for input(s): "INR", "PROTIME" in the last 168 hours.  CBC: Recent Labs  Lab 12/29/22 0524 12/30/22 0407 12/31/22 0515 01/04/23 1537  WBC 8.0 6.9 7.7 10.9*  NEUTROABS  --   --   --  8.7*  HGB 13.7 13.2 13.5 16.2  HCT 41.3 39.0 39.8 50.2  MCV 95.4 94.7 94.8 96.4  PLT 144* 152 166 245   Cardiac Enzymes: No results for input(s): "CKTOTAL", "CKMB", "CKMBINDEX", "TROPONINI" in the last 168 hours. BNP (last 3 results) No results for input(s): "PROBNP" in the last 8760 hours. CBG: Recent Labs  Lab 01/03/23 1557 01/03/23 2019 01/04/23 0756 01/04/23 1210 01/04/23 1619  GLUCAP 97 113* 114* 122* 117*   D-Dimer: No results for input(s): "DDIMER" in the last 72 hours. Hgb A1c: No results for input(s): "HGBA1C" in the last 72 hours. Lipid Profile: No results for input(s): "CHOL", "HDL", "LDLCALC", "TRIG", "CHOLHDL", "LDLDIRECT" in the last 72 hours. Thyroid function studies: No results for input(s): "TSH", "T4TOTAL", "T3FREE", "THYROIDAB" in the last 72 hours.  Invalid input(s): "FREET3" Anemia work up: No results for input(s): "VITAMINB12", "FOLATE", "FERRITIN", "TIBC", "IRON", "RETICCTPCT" in the last 72 hours. Sepsis Labs: Recent Labs  Lab 12/29/22 0524 12/30/22 0407 12/31/22 0515 01/04/23 1537  WBC 8.0 6.9 7.7 10.9*    Microbiology No results found for this or any previous visit (from the past 240 hour(s)).   Procedures and diagnostic studies:  No results found.             LOS: 11 days   Jearlean Demauro  [facility] [employer] loss adjuster on [email or web address]. If 7PM-7AM, please contact night-coverage at [email or web address]  01/04/2023, 4:36 PM

## 2023-01-05 DIAGNOSIS — E870 Hyperosmolality and hypernatremia: Secondary | ICD-10-CM

## 2023-01-05 LAB — BASIC METABOLIC PANEL
Anion gap: 11 (ref 5–15)
BUN: 46 mg/dL — ABNORMAL HIGH (ref 8–23)
CO2: 27 mmol/L (ref 22–32)
Calcium: 9.6 mg/dL (ref 8.9–10.3)
Chloride: 110 mmol/L (ref 98–111)
Creatinine, Ser: 1.26 mg/dL — ABNORMAL HIGH (ref 0.61–1.24)
GFR, Estimated: 54 mL/min — ABNORMAL LOW (ref >60)
Glucose, Bld: 119 mg/dL — ABNORMAL HIGH (ref 70–99)
Potassium: 3.4 mmol/L — ABNORMAL LOW (ref 3.5–5.1)
Sodium: 148 mmol/L — ABNORMAL HIGH (ref 135–145)

## 2023-01-05 LAB — PHOSPHORUS: Phosphorus: 3.1 mg/dL (ref 2.5–4.6)

## 2023-01-05 LAB — MAGNESIUM: Magnesium: 2.3 mg/dL (ref 1.7–2.4)

## 2023-01-05 MED ORDER — POTASSIUM CHLORIDE 20 MEQ PO PACK
40.0000 meq | PACK | Freq: Once | ORAL | Status: AC
  Administered 2023-01-05: 40 meq via ORAL
  Filled 2023-01-05: qty 2

## 2023-01-05 MED ORDER — SODIUM CHLORIDE 0.45 % IV SOLN: INTRAVENOUS | Status: DC

## 2023-01-05 MED ORDER — PANTOPRAZOLE SODIUM 40 MG PO TBEC: 40.0000 mg | DELAYED_RELEASE_TABLET | Freq: Every day | ORAL | Status: DC

## 2023-01-05 MED ORDER — PANTOPRAZOLE SODIUM 40 MG IV SOLR
40.0000 mg | INTRAVENOUS | Status: DC
  Administered 2023-01-05: 40 mg via INTRAVENOUS
  Filled 2023-01-05: qty 10

## 2023-01-05 NOTE — Progress Notes (Addendum)
Progress Note    [name]:[id number] DOB: 1929/08/06  DOA: 12/24/2022 PCP: Marina Goodell, MD      Brief Narrative:    Medical records reviewed and are as summarized below:  Adam [name] is a 89 y.o. male with dementia, hyperlipidemia, hypertension, GERD, who presented to the hospital because of altered mental status.  Reportedly, patient was found wandering in the woods by his neighbor.  He said he had been under a lot of stress since his wife passed away.  He was admitted to the hospital for sepsis possibly due to aspiration pneumonia and UTI.     Assessment/Plan:   Principal Problem:   Sepsis (HCC) Active Problems:   Essential hypertension   Gastroesophageal reflux disease without esophagitis   Primary osteoarthritis of left knee   Primary osteoarthritis of right knee   Pure hypercholesterolemia   Type 2 diabetes mellitus with stage 3 chronic kidney disease, without long-term current use of insulin (HCC)   AMS (altered mental status)   Elevated serum creatinine   Candida rash of groin   Palliative care encounter   Hypernatremia   Body mass index is 22.34 kg/m.   Sepsis secondary to UTI and aspiration pneumonia: Completed antibiotics. Staph hominis bacteremia: 2 out of 4 blood culture positive for Staph hominis.  This is thought to be a contaminant.   Acute metabolic encephalopathy versus delirium on underlying dementia: Lethargy has improved but he still has some confusion at baseline.   Hypernatremia, dehydration, AKI: Sodium is up from 1 47-1 48.  Creatinine is up from 1.23-1.26.  Continue IV fluids for hydration.  Repeat BMP tomorrow.   Hypokalemia: Replete potassium and monitor levels   Dysphagia: Ideally he should be n.p.o. but he is on dysphagia 1 diet for comfort.  His son understands that he is at increased risk for aspiration.   Urinary retention: Foley catheter was initially placed on 12/25/2022.  It was removed on  12/31/2022.  Unfortunately, patient failed voiding trial. Foley catheter was replaced on 01/01/2023.  Hematuria: Resolved.  This was attributed to pulling on catheter tube.     Other comorbidities include type II DM, hypertension, hyperlipidemia   Awaiting placement to SNF.   Plan of care discussed with his son over the phone.  Family no longer wants to pursue comfort care at this time.  Okay to continue IV fluids for now.  They want to pursue rehab and eventually get him into a long-term care facility.   Diet Order             DIET - DYS 1 Room service appropriate? Yes; Fluid consistency: Thin  Diet effective now                            Consultants: Palliative care  Procedures: None    Medications:    Chlorhexidine Gluconate Cloth  6 each Topical Daily   heparin  5,000 Units Subcutaneous Q8H   influenza vaccine adjuvanted  0.5 mL Intramuscular Tomorrow-1000   pantoprazole (PROTONIX) IV  40 mg Intravenous Q24H   scopolamine  1 patch Transdermal Q72H   Continuous Infusions:  sodium chloride 125 mL/hr at 01/05/23 0748     Anti-infectives (From admission, onward)    Start     Dose/Rate Route Frequency Ordered Stop   12/27/22 2200  azithromycin (ZITHROMAX) tablet 500 mg  Status:  Discontinued        500  mg Oral Daily at bedtime 12/27/22 1204 12/27/22 1205   12/27/22 2200  azithromycin (ZITHROMAX) 500 mg in sodium chloride 0.9 % 250 mL IVPB        500 mg 250 mL/hr over 60 Minutes Intravenous Daily at bedtime 12/27/22 1215 12/28/22 2311   12/25/22 1800  ceFEPIme (MAXIPIME) 2 g in sodium chloride 0.9 % 100 mL IVPB  Status:  Discontinued        2 g 200 mL/hr over 30 Minutes Intravenous  Once 12/24/22 1850 12/24/22 1851   12/25/22 1800  ceFEPIme (MAXIPIME) 2 g in sodium chloride 0.9 % 100 mL IVPB  Status:  Discontinued        2 g 200 mL/hr over 30 Minutes Intravenous Every 24 hours 12/24/22 1851 12/25/22 0822   12/25/22 0830  cefTRIAXone (ROCEPHIN) 1 g  in sodium chloride 0.9 % 100 mL IVPB  Status:  Discontinued        1 g 200 mL/hr over 30 Minutes Intravenous Every 24 hours 12/25/22 0823 12/30/22 1408   12/25/22 0600  metroNIDAZOLE (FLAGYL) IVPB 500 mg  Status:  Discontinued        500 mg 100 mL/hr over 60 Minutes Intravenous Every 12 hours 12/24/22 1826 12/25/22 0823   12/24/22 2315  azithromycin (ZITHROMAX) 500 mg in sodium chloride 0.9 % 250 mL IVPB  Status:  Discontinued        500 mg 250 mL/hr over 60 Minutes Intravenous Every 24 hours 12/24/22 2303 12/27/22 1204   12/24/22 2000  vancomycin (VANCOREADY) IVPB 1750 mg/350 mL        1,750 mg 175 mL/hr over 120 Minutes Intravenous  Once 12/24/22 1840 12/24/22 2327   12/24/22 1800  ceFEPIme (MAXIPIME) 2 g in sodium chloride 0.9 % 100 mL IVPB        2 g 200 mL/hr over 30 Minutes Intravenous  Once 12/24/22 1749 12/24/22 1835   12/24/22 1745  aztreonam (AZACTAM) 2 g in sodium chloride 0.9 % 100 mL IVPB  Status:  Discontinued        2 g 200 mL/hr over 30 Minutes Intravenous  Once 12/24/22 1739 12/24/22 1748   12/24/22 1745  metroNIDAZOLE (FLAGYL) IVPB 500 mg        500 mg 100 mL/hr over 60 Minutes Intravenous  Once 12/24/22 1739 12/24/22 1923   12/24/22 1745  vancomycin (VANCOCIN) IVPB 1000 mg/200 mL premix  Status:  Discontinued        1,000 mg 200 mL/hr over 60 Minutes Intravenous  Once 12/24/22 1739 12/24/22 1840              Family Communication/Anticipated D/C date and plan/Code Status   DVT prophylaxis: heparin injection 5,000 Units Start: 12/24/22 2200 Place TED hose Start: 12/24/22 1827     Code Status: Do not attempt resuscitation (DNR) - Comfort care  Family Communication: Plan discussed with Mr. Bryxton Forshey, Montez Hageman. (son), over the phone Disposition Plan: Plan to discharge to SNF   Status is: Inpatient Remains inpatient appropriate because: Awaiting placement to SNF       Subjective:   Interval events noted.  He is more alert today but he still  confused and cannot provide any history.  Objective:    Vitals:   01/04/23 2007 01/05/23 0500 01/05/23 0501 01/05/23 0747  BP: 129/79   (!) 120/57  Pulse: 100 81 81 88  Resp: 17   19  Temp: 98 F (36.7 C)   (!) 97.5 F (36.4 C)  TempSrc:  Oral  SpO2: 95% 98% 99% 98%  Weight:      Height:       No data found.   Intake/Output Summary (Last 24 hours) at 01/05/2023 1141 Last data filed at 01/05/2023 0341 Gross per 24 hour  Intake 1013.87 ml  Output --  Net 1013.87 ml   Filed Weights   12/24/22 0900 12/26/22 0423 12/26/22 2046  Weight: 68 kg 64.6 kg 64.7 kg    Exam:  GEN: NAD SKIN: Warm and dry EYES: No pallor or icterus ENT: MMM CV: RRR PULM: CTA B ABD: soft, ND, NT, +BS CNS: More alert today, oriented x 1 (person) non focal EXT: No edema or tenderness      Data Reviewed:   I have personally reviewed following labs and imaging studies:  Labs: Labs show the following:   Basic Metabolic Panel: Recent Labs  Lab 12/30/22 0407 12/31/22 0515 01/04/23 1537 01/05/23 0418  NA 141 144 147* 148*  K 3.5 4.0 3.5 3.4*  CL 104 107 109 110  CO2 29 31 23 27   GLUCOSE 112* 103* 142* 119*  BUN 39* 32* 44* 46*  CREATININE 0.98 1.01 1.23 1.26*  CALCIUM 9.0 9.3 9.7 9.6   GFR Estimated Creatinine Clearance: 34.9 mL/min (A) (by C-G formula based on SCr of 1.26 mg/dL (H)). Liver Function Tests: No results for input(s): "AST", "ALT", "ALKPHOS", "BILITOT", "PROT", "ALBUMIN" in the last 168 hours. No results for input(s): "LIPASE", "AMYLASE" in the last 168 hours. No results for input(s): "AMMONIA" in the last 168 hours. Coagulation profile No results for input(s): "INR", "PROTIME" in the last 168 hours.  CBC: Recent Labs  Lab 12/30/22 0407 12/31/22 0515 01/04/23 1537  WBC 6.9 7.7 10.9*  NEUTROABS  --   --  8.7*  HGB 13.2 13.5 16.2  HCT 39.0 39.8 50.2  MCV 94.7 94.8 96.4  PLT 152 166 245   Cardiac Enzymes: No results for input(s): "CKTOTAL", "CKMB",  "CKMBINDEX", "TROPONINI" in the last 168 hours. BNP (last 3 results) No results for input(s): "PROBNP" in the last 8760 hours. CBG: Recent Labs  Lab 01/03/23 2019 01/04/23 0756 01/04/23 1210 01/04/23 1619 01/04/23 2008  GLUCAP 113* 114* 122* 117* 124*   D-Dimer: No results for input(s): "DDIMER" in the last 72 hours. Hgb A1c: No results for input(s): "HGBA1C" in the last 72 hours. Lipid Profile: No results for input(s): "CHOL", "HDL", "LDLCALC", "TRIG", "CHOLHDL", "LDLDIRECT" in the last 72 hours. Thyroid function studies: No results for input(s): "TSH", "T4TOTAL", "T3FREE", "THYROIDAB" in the last 72 hours.  Invalid input(s): "FREET3" Anemia work up: No results for input(s): "VITAMINB12", "FOLATE", "FERRITIN", "TIBC", "IRON", "RETICCTPCT" in the last 72 hours. Sepsis Labs: Recent Labs  Lab 12/30/22 0407 12/31/22 0515 01/04/23 1537  WBC 6.9 7.7 10.9*    Microbiology No results found for this or any previous visit (from the past 240 hour(s)).   Procedures and diagnostic studies:  No results found.             LOS: 12 days   Haliey Romberg  [employer] loss adjuster on [email or web address]. If 7PM-7AM, please contact night-coverage at [email or web address]     01/05/2023, 11:41 AM

## 2023-01-05 NOTE — Progress Notes (Signed)
Pt administered IV morphine for dypsnea

## 2023-01-05 NOTE — TOC Progression Note (Signed)
Transition of Care [facility]) - Progression Note    Patient Details  Name: Adam [name] MRN: [id number] Date of Birth: [birth year]  Transition of Care [facility]) CM/SW Contact  Allena Katz, LCSW Phone Number: 01/05/2023, 2:59 PM  Clinical Narrative:   Message left with insurance to start auth.     Expected Discharge Plan: Skilled Nursing Facility Barriers to Discharge: Continued Medical Work up  Expected Discharge Plan and Services       Living arrangements for the past 2 months: Single Family Home                                       Social Determinants of Health (SDOH) Interventions SDOH Screenings   Food Insecurity: Patient Unable To Answer (12/25/2022)  Housing: Patient Unable To Answer (12/25/2022)  Transportation Needs: Patient Unable To Answer (12/25/2022)  Utilities: Patient Unable To Answer (12/25/2022)  Financial Resource Strain: Low Risk  (10/20/2022)   Received from [facility]  Tobacco Use: Medium Risk (12/24/2022)    Readmission Risk Interventions     No data to display

## 2023-01-05 NOTE — Plan of Care (Signed)
  Problem: Respiratory: Goal: Ability to maintain adequate ventilation will improve Outcome: Progressing   Problem: Clinical Measurements: Goal: Ability to maintain clinical measurements within normal limits will improve Outcome: Progressing   Problem: Activity: Goal: Risk for activity intolerance will decrease Outcome: Not Progressing   Problem: Pain Managment: Goal: General experience of comfort will improve Outcome: Progressing   Problem: Safety: Goal: Ability to remain free from injury will improve Outcome: Progressing

## 2023-01-05 NOTE — Progress Notes (Signed)
Patient medicated with morphine for dyspnea

## 2023-01-05 NOTE — Progress Notes (Deleted)
PHARMACIST - PHYSICIAN COMMUNICATION  DR:   Myriam Forehand  CONCERNING: IV to Oral Route Change Policy  RECOMMENDATION: This patient is receiving pantoprazole by the intravenous route.  Based on criteria approved by the Pharmacy and Therapeutics Committee, the intravenous medication is being converted to the equivalent oral dose form.   DESCRIPTION: These criteria include: The patient is eating (either orally or via tube) and/or has been taking other orally administered medications for a least 24 hours The patient has no evidence of active gastrointestinal bleeding or impaired GI absorption (gastrectomy, short bowel, patient on TNA or NPO).  If you have questions about this conversion, please contact the Pharmacy Department  []   [phone] )  [facility] [x]   [phone] )  [facility] []   [phone] )  [facility] []   [phone] )  [facility] []   [phone] )  [facility]    Elliot Gurney, PharmD, BCPS Clinical Pharmacist  01/05/2023 8:10 AM

## 2023-01-05 NOTE — Progress Notes (Signed)
Physical Therapy Treatment Patient Details Name: Adam [name] MRN: [id number] DOB: [birth year] Today's Date: 01/05/2023   History of Present Illness [name] is a 89 year old male with history of dementia, hyperlipidemia, hypertension, GERD, who presents to the emergency department for chief concerns of altered mental status after found wandering outside of house by neighbour. Admitted for sepsis    PT Comments  Pt found asleep in bed upon entry, pt able to wake with moderate verbal cues and light tactile touch. Pt having limited verbal communication throughout session, rare one word verbalization that were mostly incomprehensible. He required Max A +2 to perform bed mobility and sit up at EOB. Once upright pt needing intermitted support to maintain balance with short instances where pt was able to balance self with BUE on bed. Pt given heavy cues and encouragement to stand, pt did put forth physical effort, but was requiring assist to barely clear EOB, holding a crouched position for ~2 sec before sitting back down, ultimately needing Max assist to perform. Pt will benefit from continued PT services upon discharge to safely address deficits listed in patient problem list for decreased caregiver assistance and eventual return to PLOF.    If plan is discharge home, recommend the following: Assistance with cooking/housework;Assistance with feeding;Direct supervision/assist for medications management;Direct supervision/assist for financial management;Assist for transportation;Help with stairs or ramp for entrance;Two people to help with walking and/or transfers;Two people to help with bathing/dressing/bathroom   Can travel by private vehicle     No  Equipment Recommendations  None recommended by PT    Recommendations for Other Services       Precautions / Restrictions Precautions Precautions: Fall Restrictions Weight Bearing Restrictions: No     Mobility  Bed  Mobility Overal bed mobility: Needs Assistance Bed Mobility: Supine to Sit, Sit to Supine     Supine to sit: Max assist, +2 for safety/equipment Sit to supine: Max assist, +2 for safety/equipment   General bed mobility comments: Pt overall lethargic, unable to formulate responses to questions asked, Max A +2 for all bed mobility.    Transfers Overall transfer level: Needs assistance Equipment used: Rolling walker (2 wheels) Transfers: Sit to/from Stand Sit to Stand: Max assist           General transfer comment: Pt barely able to clear bottom from EOB with heavy cues for task, set up, and sequencing, pt unable to initiate positioning wihtout assist.    Ambulation/Gait               General Gait Details: currently unable   Stairs             Wheelchair Mobility     Tilt Bed    Modified Rankin (Stroke Patients Only)       Balance Overall balance assessment: Needs assistance Sitting-balance support: Feet supported Sitting balance-Leahy Scale: Poor Sitting balance - Comments: initally needing constant assit, progressed to intermittent assist with pt able to hold self up for short periods on time     Standing balance-Leahy Scale: Zero Standing balance comment: unable to balance in standing without +2 assist                            Cognition Arousal: Lethargic Behavior During Therapy: Flat affect Overall Cognitive Status: Impaired/Different from baseline  Exercises      General Comments        Pertinent Vitals/Pain Pain Assessment Pain Assessment: Faces Faces Pain Scale: Hurts a little bit Pain Location: LLE Pain Descriptors / Indicators: Discomfort, Grimacing Pain Intervention(s): Monitored during session, Limited activity within patient's tolerance    Home Living Family/patient expects to be discharged to:: Private residence                        Prior  Function            PT Goals (current goals can now be found in the care plan section) Progress towards PT goals: Progressing toward goals    Frequency    Min 1X/week      PT Plan      Co-evaluation              AM-PAC PT "6 Clicks" Mobility   Outcome Measure  Help needed turning from your back to your side while in a flat bed without using bedrails?: A Lot Help needed moving from lying on your back to sitting on the side of a flat bed without using bedrails?: A Lot Help needed moving to and from a bed to a chair (including a wheelchair)?: Total Help needed standing up from a chair using your arms (e.g., wheelchair or bedside chair)?: Total Help needed to walk in hospital room?: Total Help needed climbing 3-5 steps with a railing? : Total 6 Click Score: 8    End of Session Equipment Utilized During Treatment: Gait belt Activity Tolerance: Patient limited by fatigue Patient left: in bed;with call bell/phone within reach;with bed alarm set Nurse Communication: Mobility status PT Visit Diagnosis: Unsteadiness on feet (R26.81);Other abnormalities of gait and mobility (R26.89);Muscle weakness (generalized) (M62.81);Difficulty in walking, not elsewhere classified (R26.2)     Time: 9528-4132 PT Time Calculation (min) (ACUTE ONLY): 20 min  Charges:                           Cecile Sheerer, SPT 01/05/23, 2:33 PM

## 2023-01-06 DIAGNOSIS — A419 Sepsis, unspecified organism: Secondary | ICD-10-CM | POA: Diagnosis not present

## 2023-01-06 LAB — BASIC METABOLIC PANEL
Anion gap: 11 (ref 5–15)
BUN: 36 mg/dL — ABNORMAL HIGH (ref 8–23)
CO2: 26 mmol/L (ref 22–32)
Calcium: 9.5 mg/dL (ref 8.9–10.3)
Chloride: 112 mmol/L — ABNORMAL HIGH (ref 98–111)
Creatinine, Ser: 1.06 mg/dL (ref 0.61–1.24)
GFR, Estimated: >60 mL/min (ref >60)
Glucose, Bld: 82 mg/dL (ref 70–99)
Potassium: 3.7 mmol/L (ref 3.5–5.1)
Sodium: 149 mmol/L — ABNORMAL HIGH (ref 135–145)

## 2023-01-06 MED ORDER — LORAZEPAM 2 MG/ML IJ SOLN
1.0000 mg | INTRAMUSCULAR | Status: DC | PRN
  Administered 2023-01-06 (×2): 1 mg via INTRAVENOUS
  Filled 2023-01-06 (×2): qty 1

## 2023-01-06 NOTE — TOC Progression Note (Addendum)
Transition of Care [facility]) - Progression Note    Patient Details  Name: Adam [name] MRN: [id number] Date of Birth: December 13, 1929  [facility]) CM/SW Contact  Allena Katz, LCSW Phone Number: 01/06/2023, 10:02 AM  Clinical Narrative:   [facility] Harvey started for today for [facility].     Expected Discharge Plan: Skilled Nursing Facility Barriers to Discharge: Continued Medical Work up  Expected Discharge Plan and Services       Living arrangements for the past 2 months: Single Family Home                                       Social Determinants of Health (SDOH) Interventions SDOH Screenings   Food Insecurity: Patient Unable To Answer (12/25/2022)  Housing: Patient Unable To Answer (12/25/2022)  Transportation Needs: Patient Unable To Answer (12/25/2022)  Utilities: Patient Unable To Answer (12/25/2022)  Financial Resource Strain: Low Risk  (10/20/2022)   Received from [facility]  Tobacco Use: Medium Risk (12/24/2022)    Readmission Risk Interventions     No data to display

## 2023-01-06 NOTE — TOC Transition Note (Signed)
Transition of Care [facility]) - CM/SW Discharge Note   Patient Details  Name: Adam [name] MRN: [id number] Date of Birth: 1929-07-19  [facility]) CM/SW Contact:  Allena Katz, LCSW Phone Number: 01/06/2023, 1:54 PM   Clinical Narrative:   Family has decided and accepted [facility]. Medical Necessity printed to the unit. CSW signing off.     Final next level of care:  ([facility]) Barriers to Discharge: Continued Medical Work up   Patient Goals and CMS Choice CMS Medicare.gov Compare Post Acute Care list provided to:: Patient Choice offered to / list presented to : Patient  Discharge Placement                  Patient to be transferred to facility by: [facility]   Patient and family notified of of transfer: 01/06/23  Discharge Plan and Services Additional resources added to the After Visit Summary for                                       Social Determinants of Health (SDOH) Interventions SDOH Screenings   Food Insecurity: Patient Unable To Answer (12/25/2022)  Housing: Patient Unable To Answer (12/25/2022)  Transportation Needs: Patient Unable To Answer (12/25/2022)  Utilities: Patient Unable To Answer (12/25/2022)  Financial Resource Strain: Low Risk  (10/20/2022)   Received from [facility]  Tobacco Use: Medium Risk (12/24/2022)     Readmission Risk Interventions     No data to display

## 2023-01-06 NOTE — Plan of Care (Signed)
  Problem: Fluid Volume: Goal: Hemodynamic stability will improve Outcome: Not Progressing   Problem: Clinical Measurements: Goal: Diagnostic test results will improve Outcome: Not Progressing Goal: Signs and symptoms of infection will decrease Outcome: Not Progressing   Problem: Respiratory: Goal: Ability to maintain adequate ventilation will improve Outcome: Not Progressing   Problem: Education: Goal: Ability to describe self-care measures that may prevent or decrease complications (Diabetes Survival Skills Education) will improve Outcome: Not Progressing Goal: Individualized Educational Video(s) Outcome: Not Progressing   Problem: Coping: Goal: Ability to adjust to condition or change in health will improve Outcome: Not Progressing   Problem: Fluid Volume: Goal: Ability to maintain a balanced intake and output will improve Outcome: Not Progressing   Problem: Health Behavior/Discharge Planning: Goal: Ability to identify and utilize available resources and services will improve Outcome: Not Progressing Goal: Ability to manage health-related needs will improve Outcome: Not Progressing   Problem: Metabolic: Goal: Ability to maintain appropriate glucose levels will improve Outcome: Not Progressing   Problem: Nutritional: Goal: Maintenance of adequate nutrition will improve Outcome: Not Progressing Goal: Progress toward achieving an optimal weight will improve Outcome: Not Progressing   Problem: Skin Integrity: Goal: Risk for impaired skin integrity will decrease Outcome: Not Progressing   Problem: Tissue Perfusion: Goal: Adequacy of tissue perfusion will improve Outcome: Not Progressing   Problem: Education: Goal: Knowledge of General Education information will improve Description: Including pain rating scale, medication(s)/side effects and non-pharmacologic comfort measures Outcome: Not Progressing   Problem: Health Behavior/Discharge Planning: Goal: Ability  to manage health-related needs will improve Outcome: Not Progressing   Problem: Clinical Measurements: Goal: Ability to maintain clinical measurements within normal limits will improve Outcome: Not Progressing Goal: Will remain free from infection Outcome: Not Progressing Goal: Diagnostic test results will improve Outcome: Not Progressing Goal: Respiratory complications will improve Outcome: Not Progressing Goal: Cardiovascular complication will be avoided Outcome: Not Progressing   Problem: Activity: Goal: Risk for activity intolerance will decrease Outcome: Not Progressing   Problem: Nutrition: Goal: Adequate nutrition will be maintained Outcome: Not Progressing   Problem: Coping: Goal: Level of anxiety will decrease Outcome: Not Progressing   Problem: Elimination: Goal: Will not experience complications related to bowel motility Outcome: Not Progressing Goal: Will not experience complications related to urinary retention Outcome: Not Progressing   Problem: Pain Managment: Goal: General experience of comfort will improve Outcome: Not Progressing   Problem: Safety: Goal: Ability to remain free from injury will improve Outcome: Not Progressing   Problem: Skin Integrity: Goal: Risk for impaired skin integrity will decrease Outcome: Not Progressing   Problem: Safety: Goal: Non-violent Restraint(s) Outcome: Not Progressing

## 2023-01-06 NOTE — Care Management Important Message (Signed)
Important Message  Patient Details  Name: Adam [name] MRN: [id number] Date of Birth: September 28, 1929   Medicare Important Message Given:  Other (see comment)  Patient is on comfort care and discharging to the hospice home. Out of respect for the patient and family no Important Message from [facility] given.    Adam [name] 01/06/2023, 2:41 PM

## 2023-01-06 NOTE — Progress Notes (Signed)
[facility]- Civil engineer, contracting    Received request from Transitions of Care Manger for family interest in [facility].  Eligibility has been confirmed. Spoke with son to confirm interest and explain services.  Family agreeable to transfer today.  Transitions of Care manager aware.  RN please call report to [phone] [facility]) prior to patient leaving the unit.  Please send signed and completed DNR with patient at discharge.  Thank you  [facility] Gainer,  [facility] Liaison 336 [phone]

## 2023-01-06 NOTE — Discharge Summary (Signed)
Physician Discharge Summary   Patient: Adam [name] MRN: [id number] DOB: 1929/10/01  Admit date:     12/24/2022  Discharge date: 01/06/23  Discharge Physician: Lurene Shadow   PCP: Marina Goodell, MD   Recommendations at discharge:   Follow-up with hospice team within 12 hours of discharge  Discharge Diagnoses: Principal Problem:   Sepsis (HCC) Active Problems:   Essential hypertension   Gastroesophageal reflux disease without esophagitis   Primary osteoarthritis of left knee   Primary osteoarthritis of right knee   Pure hypercholesterolemia   Type 2 diabetes mellitus with stage 3 chronic kidney disease, without long-term current use of insulin (HCC)   AMS (altered mental status)   Elevated serum creatinine   Candida rash of groin   Palliative care encounter   Hypernatremia  Resolved Problems:   * No resolved hospital problems. *  Hospital Course:  Adam [name] is a 89 y.o. male with dementia, hyperlipidemia, hypertension, GERD, who presented to the hospital because of altered mental status.  Reportedly, patient was found wandering in the woods by his neighbor.  He said he had been under a lot of stress since his wife passed away.   He was admitted to the hospital for sepsis possibly due to aspiration pneumonia and UTI complicated by acute metabolic encephalopathy versus delirium.  He was treated with empiric IV antibiotics and IV fluids.   Foley catheter was placed for urinary retention. He has severe dysphagia and speech therapist recommended strict NPO.  However, his family requested that patient be fed with dysphagia 1 diet so he could have some nutrition.  Unfortunately, oral intake has been very poor.  He developed dehydration, hypernatremia and acute kidney injury and was treated with IV fluids.   Diagnoses, prognosis and goals of care were discussed with his son, Adam [name].  After the discussions, he opted for full comfort measures and requested  evaluation from the hospice team.  Patient was evaluated by the hospice team and he is deemed eligible for discharge to hospice house.        Consultants: Hospice and palliative care team Procedures performed: None Disposition: Hospice care Diet recommendation:  Diet as tolerated DISCHARGE MEDICATION: Allergies as of 01/06/2023       Reactions   Penicillin V Potassium Rash        Medication List     STOP taking these medications    acetaminophen 650 MG CR tablet Commonly known as: TYLENOL   ascorbic acid 500 MG tablet Commonly known as: VITAMIN C   atorvastatin 10 MG tablet Commonly known as: LIPITOR   cetirizine 10 MG tablet Commonly known as: ZYRTEC   D 1000 25 MCG (1000 UT) capsule Generic drug: Cholecalciferol   donepezil 10 MG tablet Commonly known as: ARICEPT   enalapril 2.5 MG tablet Commonly known as: VASOTEC   Fish Oil 1000 MG Caps   hydrochlorothiazide 25 MG tablet Commonly known as: HYDRODIURIL   memantine 10 MG tablet Commonly known as: NAMENDA   mirtazapine 15 MG tablet Commonly known as: REMERON   mirtazapine 30 MG tablet Commonly known as: REMERON   Multi-Vitamins Tabs   nystatin cream Commonly known as: MYCOSTATIN   nystatin-triamcinolone ointment Commonly known as: MYCOLOG   omeprazole 20 MG capsule Commonly known as: Merchandiser, retail COVID-19 Vac Bivalent injection Generic drug: COVID-19 mRNA bivalent vaccine (Pfizer)   risperiDONE 0.25 MG tablet Commonly known as: RISPERDAL        Contact information for after-discharge  care     Destination     [facility] Preferred SNF .   Service: Skilled Nursing Contact information: [address] [place] [address] [phone]                    Discharge Exam: Ceasar Mons Weights   12/24/22 0900 12/26/22 0423 12/26/22 2046  Weight: 68 kg 64.6 kg 64.7 kg   GEN: NAD SKIN: Warm and dry EYES: No pallor or  icterus ENT: Dry mucous membranes CV: RRR PULM: CTA B ABD: soft, ND, NT, +BS CNS: Alert but confused and disoriented EXT: No edema or tenderness GU: Foley catheter draining amber urine   Condition at discharge: stable  The results of significant diagnostics from this hospitalization (including imaging, microbiology, ancillary and laboratory) are listed below for reference.   Imaging Studies: DG Chest Port 1 View  Result Date: 12/28/2022 CLINICAL DATA:  Leukocytosis, tachypnea.  Aspiration pneumonia. EXAM: PORTABLE CHEST 1 VIEW COMPARISON:  December 24, 2022. FINDINGS: The heart size and mediastinal contours are within normal limits. Calcified pleural plaques are again noted bilaterally consistent with asbestos exposure. Minimal bibasilar subsegmental atelectasis or scarring is noted. The visualized skeletal structures are unremarkable. IMPRESSION: Minimal bibasilar subsegmental atelectasis or scarring. Calcified pleural plaques are noted bilaterally consistent with asbestos exposure. Electronically Signed   By: Lupita Raider M.D.   On: 12/28/2022 10:42   DG Swallowing Func-Speech Pathology  Result Date: 12/26/2022 Table formatting from the original result was not included. Modified Barium Swallow Study Patient Details Name: Adam [name] MRN: [id number] Date of Birth: 1929/05/29 Today's Date: 12/26/2022 HPI/PMH: HPI: Pt is a 89 year old male with history of Dementia, hyperlipidemia, hypertension, GERD, who presents to the emergency department for chief concerns of altered mental status.  Per ED documentation, patient was found by the woods wandering by his neighbor.     Vitals in the ED showed temperature of 95.4, improved to 97.7 with bear hugger, respiration rate 21, heart rate of 86, blood pressure 150/93, SpO2 97% on room air. Serum sodium is 138, potassium 4.7, chloride 102.  CXR: No acute cardiopulmonary disease.  2. Chronic calcified pleural plaques. Clinical Impression: Clinical  Impression: Pt presents with profound oropharyngeal dysphagia that appears to be sensorimotor in nature resulting in silent and sensed aspiration of thin liquids and nectar thick liquids when consuming via spoon. Pt's oral phase is c/b decreased bolus cohesion, weak AP tranfer with premature passive spillage of boluses into pharynx and widespread oral residue post swallow. Pt's pharyngeal response is significantly weak with inability to achieve airway protection. Gross amounts of sensed and silent aspiration are observed during and following swallow initiation. In addition to sensed and silent aspiration, pt observed with significant pharyngeal residue that he is not able to clear despite numerous cued swallows. At this time, a safe PO diet cannot be recommended.  Given the nature of pt's oropharyngela impairment, a feeding tube is not likely to reduce pt's risk of aspirating his own salvia. Recommend proactive oral care to reduce bacterial load of salvia and consult to Palliative Care to determine GOC. Factors that may increase risk of adverse event in presence of aspiration Rubye Oaks & Clearance Coots 2021): Factors that may increase risk of adverse event in presence of aspiration Rubye Oaks & Clearance Coots 2021): Reduced cognitive function; Frail or deconditioned; Dependence for feeding and/or oral hygiene; Weak cough; Frequent aspiration of large volumes Recommendations/Plan: Swallowing Evaluation Recommendations Swallowing Evaluation Recommendations Recommendations: NPO Medication Administration: Via alternative  means Oral care recommendations: Oral care QID (4x/day) Recommended consults: Consider Palliative care Caregiver Recommendations: -- (TBD) Treatment Plan Treatment Plan Treatment recommendations: No treatment recommended at this time Follow-up recommendations: No SLP follow up Recommendations Comment: consult of Palliative Care Functional status assessment: Patient has had a recent decline in their functional status  and/or demonstrates limited ability to make significant improvements in function in a reasonable and predictable amount of time. Recommendations Recommendations for follow up therapy are one component of a multi-disciplinary discharge planning process, led by the attending physician.  Recommendations may be updated based on patient status, additional functional criteria and insurance authorization. Assessment: Orofacial Exam: Orofacial Exam Oral Cavity: Oral Hygiene: WFL Oral Cavity - Dentition: Edentulous (has full set of dentures - not available at this time) Orofacial Anatomy: WFL Oral Motor/Sensory Function: Generalized oral weakness Anatomy: Anatomy: WFL Boluses Administered: Boluses Administered Boluses Administered: Thin liquids (Level 0); Mildly thick liquids (Level 2, nectar thick)  Oral Impairment Domain: Oral Impairment Domain Lip Closure: No labial escape Tongue control during bolus hold: Not tested (pt unable to achieve) Bolus transport/lingual motion: Repetitive/disorganized tongue motion Oral residue: Residue collection on oral structures; Majority of bolus remaining Location of oral residue : Lateral sulci; Floor of mouth; Tongue Initiation of pharyngeal swallow : Pyriform sinuses  Pharyngeal Impairment Domain: Pharyngeal Impairment Domain Soft palate elevation: No bolus between soft palate (SP)/pharyngeal wall (PW) Laryngeal elevation: Minimal superior movement of thyroid cartilage with minimal approximation of arytenoids to epiglottic petiole; No superior movement of thyroid cartilage Anterior hyoid excursion: Partial anterior movement; No anterior movement Epiglottic movement: No inversion Laryngeal vestibule closure: None, wide column air/contrast in laryngeal vestibule; Incomplete, narrow column air/contrast in laryngeal vestibule Pharyngeal stripping wave : Present - diminished Pharyngoesophageal segment opening: Complete distension and complete duration, no obstruction of flow Tongue base  retraction: Trace column of contrast or air between tongue base and PPW; Narrow column of contrast or air between tongue base and PPW Pharyngeal residue: Majority of contrast within or on pharyngeal structures; Minimal to no pharyngeal clearance Location of pharyngeal residue: Valleculae; Pharyngeal wall  Esophageal Impairment Domain: Esophageal Impairment Domain Esophageal clearance upright position: Complete clearance, esophageal coating Pill: No data recorded Penetration/Aspiration Scale Score: Penetration/Aspiration Scale Score 4.  Material enters airway, CONTACTS cords then ejected out: Thin liquids (Level 0); Mildly thick liquids (Level 2, nectar thick) 5.  Material enters airway, CONTACTS cords and not ejected out: Thin liquids (Level 0); Mildly thick liquids (Level 2, nectar thick) 6.  Material enters airway, passes BELOW cords then ejected out: Thin liquids (Level 0); Mildly thick liquids (Level 2, nectar thick) 7.  Material enters airway, passes BELOW cords and not ejected out despite cough attempt by patient: Thin liquids (Level 0); Mildly thick liquids (Level 2, nectar thick) 8.  Material enters airway, passes BELOW cords without attempt by patient to eject out (silent aspiration) : Thin liquids (Level 0); Mildly thick liquids (Level 2, nectar thick) Compensatory Strategies: No data recorded  General Information: Caregiver present: No  Diet Prior to this Study: NPO   Temperature : Normal   Respiratory Status: WFL   Supplemental O2: None (Room air)   History of Recent Intubation: No  Behavior/Cognition: Alert; Cooperative; Pleasant mood; Confused; Distractible; Requires cueing (baseline dementia, he is able to follow instructions for multiple swallows) Self-Feeding Abilities: Able to self-feed; Needs assist with self-feeding Baseline vocal quality/speech: Normal Volitional Cough: Unable to elicit Volitional Swallow: Unable to elicit Exam Limitations: No limitations Goal Planning: Prognosis for improved  oropharyngeal function: Guarded Barriers to Reach Goals: Cognitive deficits; Severity of deficits; Overall medical prognosis Barriers/Prognosis Comment: profound sensed and silent aspiration, dementia, GERD Patient/Family Stated Goal: none stated Consulted and agree with results and recommendations: Pt unable/family or caregiver not available; Physician; Nurse Pain: Pain Assessment Pain Assessment: Faces Faces Pain Scale: 0 End of Session: Start Time:SLP Start Time (ACUTE ONLY): 0755 Stop Time: SLP Stop Time (ACUTE ONLY): 0810 Time Calculation:SLP Time Calculation (min) (ACUTE ONLY): 15 min Charges: SLP Evaluations $ SLP Speech Visit: 1 Visit SLP Evaluations $BSS Swallow: 1 Procedure $MBS Swallow: 1 Procedure SLP visit diagnosis: SLP Visit Diagnosis: Dysphagia, oropharyngeal phase (R13.12); Dysphagia, pharyngoesophageal phase (R13.14) Past Medical History: Past Medical History: Diagnosis Date  Acute prostatitis   BPH (benign prostatic hypertrophy)   Dysphagia   Elevated PSA   Esophageal reflux   Frequency   HTN (hypertension)   Nocturia   Osteoarthritis   Unilateral inguinal hernia   Urgency of micturation  Past Surgical History: Past Surgical History: Procedure Laterality Date  CATARACT EXTRACTION Bilateral   HERNIA REPAIR    bilateral inguinal  LEG SURGERY    leg trauma as a child Happi Overton 12/26/2022, 8:43 AM  DG Chest Portable 1 View  Result Date: 12/24/2022 CLINICAL DATA:  Altered mental status.  Weakness. EXAM: PORTABLE CHEST 1 VIEW COMPARISON:  AP chest 10/16/2017, CT chest 10/16/2017 FINDINGS: Cardiac silhouette and mediastinal contours are within normal limits. Mild calcification within the aortic arch. There is again flattening of the diaphragms. Calcified pleural plaques are again seen, seen predominantly on prior CT within the anterior superior right-greater-than-left and also the posterosuperior left-greater-than-right regions. Additional right hemidiaphragm basilar calcified pleural plaque. Mild  bilateral chronic interstitial thickening. No definite new acute airspace opacity is visualized. No definite pleural effusion. No pneumothorax. Moderate bilateral glenohumeral osteoarthritis. IMPRESSION: 1. No acute cardiopulmonary disease. 2. Chronic calcified pleural plaques. Electronically Signed   By: Neita Garnet M.D.   On: 12/24/2022 13:05    Microbiology: Results for orders placed or performed during the hospital encounter of 12/24/22  SARS Coronavirus 2 by RT PCR (hospital order, performed in [facility] hospital lab) *cepheid single result test* Anterior Nasal Swab     Status: None   Collection Time: 12/24/22  2:06 PM   Specimen: Anterior Nasal Swab  Result Value Ref Range Status   SARS Coronavirus 2 by RT PCR NEGATIVE NEGATIVE Final    Comment: (NOTE) SARS-CoV-2 target nucleic acids are NOT DETECTED.  The SARS-CoV-2 RNA is generally detectable in upper and lower respiratory specimens during the acute phase of infection. The lowest concentration of SARS-CoV-2 viral copies this assay can detect is 250 copies / mL. A negative result does not preclude SARS-CoV-2 infection and should not be used as the sole basis for treatment or other patient management decisions.  A negative result may occur with improper specimen collection / handling, submission of specimen other than nasopharyngeal swab, presence of viral mutation(s) within the areas targeted by this assay, and inadequate number of viral copies (<250 copies / mL). A negative result must be combined with clinical observations, patient history, and epidemiological information.  Fact Sheet for Patients:   RoadLapTop.co.za  Fact Sheet for Healthcare Providers: http://kim-miller.com/  This test is not yet approved or  cleared by the [place] FDA and has been authorized for detection and/or diagnosis of SARS-CoV-2 by FDA under an Emergency Use Authorization (EUA).  This EUA will  remain in effect (meaning this test can be used) for the  duration of the COVID-19 declaration under Section 564(b)(1) of the Act, 21 U.S.C. section 360bbb-3(b)(1), unless the authorization is terminated or revoked sooner.  Performed at [facility], [address] [place], [place] [address]   Blood culture (routine x 2)     Status: None   Collection Time: 12/24/22  2:06 PM   Specimen: BLOOD  Result Value Ref Range Status   Specimen Description BLOOD BLOOD LEFT FOREARM  Final   Special Requests   Final    BOTTLES DRAWN AEROBIC AND ANAEROBIC Blood Culture results may not be optimal due to an inadequate volume of blood received in culture bottles   Culture   Final    NO GROWTH 5 DAYS Performed at [facility], [address], [place], [place] [address]    Report Status 12/29/2022 FINAL  Final  Blood culture (routine x 2)     Status: Abnormal   Collection Time: 12/24/22  2:07 PM   Specimen: BLOOD  Result Value Ref Range Status   Specimen Description   Final    BLOOD BLOOD RIGHT FOREARM Performed at [facility], [address], [place], [place] [address]    Special Requests   Final    BOTTLES DRAWN AEROBIC AND ANAEROBIC Blood Culture results may not be optimal due to an inadequate volume of blood received in culture bottles Performed at [facility], [address] [place], [place] [address]    Culture  Setup Time   Final    GRAM POSITIVE COCCI IN BOTH AEROBIC AND ANAEROBIC BOTTLES Organism ID to follow CRITICAL RESULT CALLED TO, READ BACK BY AND VERIFIED WITH: [email or web address]  12/25/22 RH Performed at [facility], [address] [place], [place] [address]    Culture (A)  Final    STAPHYLOCOCCUS HOMINIS THE SIGNIFICANCE OF ISOLATING THIS ORGANISM FROM A SINGLE SET OF BLOOD CULTURES WHEN MULTIPLE SETS ARE DRAWN IS UNCERTAIN. PLEASE NOTIFY THE MICROBIOLOGY DEPARTMENT WITHIN ONE WEEK IF SPECIATION AND SENSITIVITIES ARE  REQUIRED. Performed at [facility], [address], [place], [place] [address]    Report Status 12/27/2022 FINAL  Final  Blood Culture ID Panel (Reflexed)     Status: Abnormal   Collection Time: 12/24/22  2:07 PM  Result Value Ref Range Status   Enterococcus faecalis NOT DETECTED NOT DETECTED Final   Enterococcus Faecium NOT DETECTED NOT DETECTED Final   Listeria monocytogenes NOT DETECTED NOT DETECTED Final   Staphylococcus species DETECTED (A) NOT DETECTED Final    Comment: CRITICAL RESULT CALLED TO, READ BACK BY AND VERIFIED WITH: Harrold Donath [id number] 12/25/22 RH    Staphylococcus aureus (BCID) NOT DETECTED NOT DETECTED Final   Staphylococcus epidermidis NOT DETECTED NOT DETECTED Final   Staphylococcus lugdunensis NOT DETECTED NOT DETECTED Final   Streptococcus species NOT DETECTED NOT DETECTED Final   Streptococcus agalactiae NOT DETECTED NOT DETECTED Final   Streptococcus pneumoniae NOT DETECTED NOT DETECTED Final   Streptococcus pyogenes NOT DETECTED NOT DETECTED Final   A.calcoaceticus-baumannii NOT DETECTED NOT DETECTED Final   Bacteroides fragilis NOT DETECTED NOT DETECTED Final   Enterobacterales NOT DETECTED NOT DETECTED Final   Enterobacter cloacae complex NOT DETECTED NOT DETECTED Final   Escherichia coli NOT DETECTED NOT DETECTED Final   Klebsiella aerogenes NOT DETECTED NOT DETECTED Final   Klebsiella oxytoca NOT DETECTED NOT DETECTED Final   Klebsiella pneumoniae NOT DETECTED NOT DETECTED Final   Proteus species NOT DETECTED NOT DETECTED Final   Salmonella species NOT DETECTED NOT DETECTED  Final   Serratia marcescens NOT DETECTED NOT DETECTED Final   Haemophilus influenzae NOT DETECTED NOT DETECTED Final   Neisseria meningitidis NOT DETECTED NOT DETECTED Final   Pseudomonas aeruginosa NOT DETECTED NOT DETECTED Final   Stenotrophomonas maltophilia NOT DETECTED NOT DETECTED Final   Candida albicans NOT DETECTED NOT DETECTED Final   Candida auris NOT DETECTED NOT  DETECTED Final   Candida glabrata NOT DETECTED NOT DETECTED Final   Candida krusei NOT DETECTED NOT DETECTED Final   Candida parapsilosis NOT DETECTED NOT DETECTED Final   Candida tropicalis NOT DETECTED NOT DETECTED Final   Cryptococcus neoformans/gattii NOT DETECTED NOT DETECTED Final    Comment: Performed at [facility], [address] [place], [place] [address]  Culture, blood (Routine X 2) w Reflex to ID Panel     Status: None   Collection Time: 12/25/22  9:19 AM   Specimen: BLOOD LEFT HAND  Result Value Ref Range Status   Specimen Description BLOOD LEFT HAND  Final   Special Requests   Final    BOTTLES DRAWN AEROBIC AND ANAEROBIC Blood Culture adequate volume   Culture   Final    NO GROWTH 5 DAYS Performed at [facility], [address] [place], [place] [address]    Report Status 12/30/2022 FINAL  Final  Culture, blood (Routine X 2) w Reflex to ID Panel     Status: None   Collection Time: 12/25/22  9:27 AM   Specimen: BLOOD LEFT HAND  Result Value Ref Range Status   Specimen Description BLOOD LEFT HAND  Final   Special Requests   Final    BOTTLES DRAWN AEROBIC AND ANAEROBIC Blood Culture adequate volume   Culture   Final    NO GROWTH 5 DAYS Performed at [facility], [address] [place], [place] [address]    Report Status 12/30/2022 FINAL  Final  Urine Culture (for pregnant, neutropenic or urologic patients or patients with an indwelling urinary catheter)     Status: None   Collection Time: 12/25/22 10:29 AM   Specimen: In/Out Cath Urine  Result Value Ref Range Status   Specimen Description   Final    IN/OUT CATH URINE Performed at [facility], [address] [place], [place] [address]    Special Requests   Final    NONE Performed at [facility], [address], [place], [place] [address]    Culture   Final    NO GROWTH Performed at [facility], [address] [place] [address] [place], [place] [address]    Report  Status 12/26/2022 FINAL  Final    Labs: CBC: Recent Labs  Lab 12/31/22 0515 01/04/23 1537  WBC 7.7 10.9*  NEUTROABS  --  8.7*  HGB 13.5 16.2  HCT 39.8 50.2  MCV 94.8 96.4  PLT 166 245   Basic Metabolic Panel: Recent Labs  Lab 12/31/22 0515 01/04/23 1537 01/05/23 0418 01/06/23 0502  NA 144 147* 148* 149*  K 4.0 3.5 3.4* 3.7  CL 107 109 110 112*  CO2 31 23 27 26   GLUCOSE 103* 142* 119* 82  BUN 32* 44* 46* 36*  CREATININE 1.01 1.23 1.26* 1.06  CALCIUM 9.3 9.7 9.6 9.5  MG  --   --  2.3  --   PHOS  --   --  3.1  --    Liver Function Tests: No results for input(s): "AST", "ALT", "ALKPHOS", "BILITOT", "PROT", "ALBUMIN" in the last 168 hours. CBG: Recent Labs  Lab 01/03/23 2019 01/04/23 0756 01/04/23 1210 01/04/23 1619 01/04/23 2008  GLUCAP 113* 114* 122* 117* 124*    Discharge time spent: greater than 30 minutes.  Signed: Lurene Shadow, MD [facility] 01/06/2023

## 2023-01-06 NOTE — Progress Notes (Signed)
[facility] Called hosp[facility] Home and gave report to [place], [place]. Patient will be transported with bilateral PIVs and foley in place. All paperwork in discharge packet as well as DNR. Patient will be transported via EMS.

## 2023-01-20 DEATH — deceased
